# Patient Record
Sex: Female | Born: 1965 | Race: White | Hispanic: No | Marital: Married | Smoking: Former smoker
Health system: Southern US, Community
[De-identification: ages and names within clinical notes are randomized; demographics above are authoritative.]

## PROBLEM LIST (undated history)

## (undated) DIAGNOSIS — R51 Headache: Secondary | ICD-10-CM

## (undated) DIAGNOSIS — G35D Multiple sclerosis, unspecified: Secondary | ICD-10-CM

## (undated) DIAGNOSIS — R519 Headache, unspecified: Secondary | ICD-10-CM

## (undated) DIAGNOSIS — G35 Multiple sclerosis: Secondary | ICD-10-CM

## (undated) DIAGNOSIS — F329 Major depressive disorder, single episode, unspecified: Secondary | ICD-10-CM

## (undated) DIAGNOSIS — F32A Depression, unspecified: Secondary | ICD-10-CM

## (undated) DIAGNOSIS — E039 Hypothyroidism, unspecified: Secondary | ICD-10-CM

## (undated) HISTORY — DX: Headache, unspecified: R51.9

## (undated) HISTORY — DX: Headache: R51

## (undated) HISTORY — DX: Depression, unspecified: F32.A

## (undated) HISTORY — PX: AUGMENTATION MAMMAPLASTY: SUR837

## (undated) HISTORY — DX: Multiple sclerosis, unspecified: G35.D

## (undated) HISTORY — DX: Major depressive disorder, single episode, unspecified: F32.9

## (undated) HISTORY — PX: OTHER SURGICAL HISTORY: SHX169

## (undated) HISTORY — DX: Hypothyroidism, unspecified: E03.9

## (undated) HISTORY — PX: VAGINAL HYSTERECTOMY: SUR661

## (undated) HISTORY — DX: Multiple sclerosis: G35

---

## 2001-12-14 ENCOUNTER — Encounter: Payer: Self-pay | Admitting: Emergency Medicine

## 2001-12-14 ENCOUNTER — Emergency Department (HOSPITAL_COMMUNITY): Admission: EM | Admit: 2001-12-14 | Discharge: 2001-12-15 | Payer: Self-pay | Admitting: Emergency Medicine

## 2003-05-05 ENCOUNTER — Ambulatory Visit (HOSPITAL_COMMUNITY): Admission: RE | Admit: 2003-05-05 | Discharge: 2003-05-05 | Payer: Self-pay | Admitting: Podiatry

## 2003-09-17 ENCOUNTER — Ambulatory Visit (HOSPITAL_COMMUNITY): Admission: RE | Admit: 2003-09-17 | Discharge: 2003-09-17 | Payer: Self-pay | Admitting: Podiatry

## 2004-03-08 ENCOUNTER — Ambulatory Visit (HOSPITAL_COMMUNITY): Admission: RE | Admit: 2004-03-08 | Discharge: 2004-03-08 | Payer: Self-pay | Admitting: Family Medicine

## 2004-09-20 ENCOUNTER — Ambulatory Visit (HOSPITAL_COMMUNITY): Admission: RE | Admit: 2004-09-20 | Discharge: 2004-09-20 | Payer: Self-pay | Admitting: Obstetrics & Gynecology

## 2006-02-07 ENCOUNTER — Ambulatory Visit (HOSPITAL_COMMUNITY): Admission: RE | Admit: 2006-02-07 | Discharge: 2006-02-07 | Payer: Self-pay | Admitting: Obstetrics & Gynecology

## 2007-03-16 ENCOUNTER — Emergency Department (HOSPITAL_COMMUNITY): Admission: EM | Admit: 2007-03-16 | Discharge: 2007-03-16 | Payer: Self-pay | Admitting: Emergency Medicine

## 2007-04-04 ENCOUNTER — Observation Stay (HOSPITAL_COMMUNITY): Admission: RE | Admit: 2007-04-04 | Discharge: 2007-04-05 | Payer: Self-pay | Admitting: Obstetrics & Gynecology

## 2007-04-04 ENCOUNTER — Encounter: Payer: Self-pay | Admitting: Obstetrics & Gynecology

## 2007-08-03 ENCOUNTER — Ambulatory Visit (HOSPITAL_COMMUNITY): Admission: RE | Admit: 2007-08-03 | Discharge: 2007-08-03 | Payer: Self-pay | Admitting: Family Medicine

## 2007-10-09 ENCOUNTER — Encounter (HOSPITAL_COMMUNITY): Admission: RE | Admit: 2007-10-09 | Discharge: 2007-11-08 | Payer: Self-pay | Admitting: Internal Medicine

## 2008-04-06 ENCOUNTER — Emergency Department (HOSPITAL_COMMUNITY): Admission: EM | Admit: 2008-04-06 | Discharge: 2008-04-06 | Payer: Self-pay | Admitting: Emergency Medicine

## 2008-07-29 ENCOUNTER — Ambulatory Visit (HOSPITAL_COMMUNITY): Admission: RE | Admit: 2008-07-29 | Discharge: 2008-07-29 | Payer: Self-pay | Admitting: Family Medicine

## 2008-10-29 ENCOUNTER — Ambulatory Visit (HOSPITAL_COMMUNITY): Admission: RE | Admit: 2008-10-29 | Discharge: 2008-10-29 | Payer: Self-pay | Admitting: Obstetrics & Gynecology

## 2008-12-08 ENCOUNTER — Ambulatory Visit (HOSPITAL_COMMUNITY): Admission: RE | Admit: 2008-12-08 | Discharge: 2008-12-08 | Payer: Self-pay | Admitting: Neurology

## 2009-11-12 ENCOUNTER — Ambulatory Visit (HOSPITAL_COMMUNITY): Admission: RE | Admit: 2009-11-12 | Discharge: 2009-11-12 | Payer: Self-pay | Admitting: Obstetrics & Gynecology

## 2010-03-16 ENCOUNTER — Ambulatory Visit: Payer: Self-pay | Admitting: Internal Medicine

## 2010-03-23 ENCOUNTER — Ambulatory Visit (HOSPITAL_COMMUNITY)
Admission: RE | Admit: 2010-03-23 | Discharge: 2010-03-23 | Payer: Self-pay | Source: Home / Self Care | Attending: Internal Medicine | Admitting: Internal Medicine

## 2010-04-18 ENCOUNTER — Encounter: Payer: Self-pay | Admitting: Family Medicine

## 2010-04-19 ENCOUNTER — Encounter: Payer: Self-pay | Admitting: Family Medicine

## 2010-08-10 NOTE — Op Note (Signed)
NAME:  Sandra Carey, Sandra Carey NO.:  [id number]   MEDICAL RECORD NO.:  [id number]          PATIENT TYPE:  OBV   LOCATION:  A331                          FACILITY:  [facility]   PHYSICIAN:  Lazaro Arms, M.D.   DATE OF BIRTH:  April 29, 1965   DATE OF PROCEDURE:  04/04/2007  DATE OF DISCHARGE:                               OPERATIVE REPORT   PREOPERATIVE DIAGNOSES:  1. Dyspareunia.  2. Menometrorrhagia.  3. Dysmenorrhea.  4. Probable adenomyosis.   POSTOPERATIVE DIAGNOSES:  1. Dyspareunia.  2. Menometrorrhagia.  3. Dysmenorrhea.  4. Probable adenomyosis.   PROCEDURE:  Vaginal hysterectomy.   SURGEON:  Lazaro Arms, M.D.   ANESTHESIA:  General endotracheal.   FINDINGS:  The patient had a boggy, soft uterus maybe a small fibroid.  The tubes and ovaries normal.   DESCRIPTION OF OPERATION:  The patient was taken to the operating room,  placed in the supine position where she underwent general tracheal  anesthesia.  She was then placed in dorsal spine position, prepped,  draped in usual sterile fashion.  Half percent Marcaine was injected  circumferentially about the cervix for hemostasis and plane development.  Electrocautery unit was used and the vagina was incised  circumferentially about the cervix.  The anterior and posterior vagina  were pushed off the lower uterine segment without difficulty.  The  posterior cul-de-sac was entered sharply without difficulty.  The  uterosacral ligaments were clamped, cut and suture ligated and held.  The cardinal ligaments were clamped, cut and suture ligated and held.  The anterior vagina was pushed off the lower uterine segment but the  peritoneal reflection was high, so several passes taken up the cervix  through the cardinal ligament, each pedicle being clamped, cut and  suture ligated.  Anterior cul-de-sac was entered sharply without  difficulty.  The anterior posterior leaves of the broad ligament and the  uterine vessels  were clamped, cut and suture ligated bilaterally.  Serial pedicles taken up the cervix, up the fundus of the uterus, each  pedicle being clamped, cut and suture ligated.  The utero-ovarian  ligaments were crossclamped.  Specimen was removed.  Fore and aft single  sutures were placed with good hemostasis bilaterally.  Pelvis was  irrigated vigorously.  All pedicles found to be hemostatic.  The ovarian  sutures then cut free, the peritoneum was closed in a pursestring  fashion with 3-0 Monocryl.  The anterior and posterior vagina were  closed interrupted fashion, side to side with 0-0 Vicryl  without difficulty.  Again was hemostatic.  Blood loss of the procedure  was 100 mL.  She received Ancef 1 gram preoperatively.  She had SCDs  prior to initiation of anesthesia and throughout the procedure.  She was  awakened from anesthesia, taken to recovery room in good stable  condition.  All counts correct.      Lazaro Arms, M.D.  Electronically Signed     LHE/MEDQ  D:  04/04/2007  T:  04/04/2007  Job:  [id number]

## 2010-08-13 NOTE — Op Note (Signed)
NAME:  Sandra Carey, Sandra Carey NO.:  [id number]   MEDICAL RECORD NO.:  [id number]          PATIENT TYPE:  AMB   LOCATION:  DAY                           FACILITY:  [facility]   PHYSICIAN:  Lazaro Arms, M.D.   DATE OF BIRTH:  02-Aug-1965   DATE OF PROCEDURE:  09/20/2004  DATE OF DISCHARGE:                                 OPERATIVE REPORT   PREOPERATIVE DIAGNOSIS:  Multiparous female, desires permanent  sterilization.   POSTOPERATIVE DIAGNOSIS:  Multiparous female, desires permanent  sterilization.   PROCEDURES:  Laparoscopic bilateral tubal ligation using electrocautery.   SURGEON:  Lazaro Arms, M.D.   ANESTHESIA:  General endotracheal anesthesia.   FINDINGS:  The patient had normal uterus, tubes and ovaries and normal  intrauterine cavity.   DESCRIPTION OF OPERATION:  The patient was taken to the operating room and  placed in the supine position where she underwent general endotracheal  anesthesia.  She was then placed in the dorsolithotomy position and prepped  and draped in the usual sterile fashion.  An incision was made in the  umbilicus and a Veress needle was used and placed in the peritoneal cavity  with one pass without difficulty.  The peritoneal cavity was confirmed and  insufflated.  The nonbladed 11 mm trocar was used with direct visualization  laparoscope and we entered the peritoneal cavity with one pass without  difficulty without injury.  The pelvis was evaluated and found to be normal.  Both tubes were identified.  The left fallopian tube was burned in the  distal isthmic and ampullary regions of each tube.  No resistance beyond  using the electrocautery unit.  The right fallopian tube was identified and  burning the distal isthmic and ampullary regions of the tube, a 2.5 cm  segment bilaterally, again with good hemostasis.  They were burned to no  resistance and beyond.  They were checked once again and there was no  further bubbling or  cauterization of the tubal segments.  The instruments  were removed.  The gas was allowed to escape.  The fascia was closed using 0  Vicryl suture.  The subcutaneous tissue was closed using 3-0 Vicryl and the  skin was closed using Dermabond.  Marcaine 0.5% 6 mL was injected as a local  anesthetic.  The patient tolerated the procedure well.  She experienced no  blood loss and was taken to recovery in good stable condition.  All counts  were correct x 3.  She received Ancef prophylactically.       LHE/MEDQ  D:  09/20/2004  T:  09/20/2004  Job:  [id number]

## 2010-08-13 NOTE — Op Note (Signed)
NAME:  Sandra Carey, Sandra Carey                          ACCOUNT NO.:  [id number]   MEDICAL RECORD NO.:  [id number]                   PATIENT TYPE:  AMB   LOCATION:  DAY                                  FACILITY:  [facility]   PHYSICIAN:  Oley Balm. Pricilla Holm, D.P.M.             DATE OF BIRTH:  1966-02-12   DATE OF PROCEDURE:  05/05/2003  DATE OF DISCHARGE:                                 OPERATIVE REPORT   SURGEON:  Oley Balm. Pricilla Holm, D.P.M.   ANESTHESIA:  Monitored anesthesia care.   PREOPERATIVE DIAGNOSIS:  Hallux valgus deformity of right foot.   POSTOPERATIVE DIAGNOSIS:  Hallux valgus deformity of right foot.   PROCEDURE:  Austin bunionectomy, right foot.   INDICATIONS FOR PROCEDURE:  Longstanding history of pain unrelieved by  conservative care.   DESCRIPTION OF PROCEDURE:  The patient was brought into the operating room  and placed on the operating table in the supine position.  The patient's  lower right foot and leg was then prepped and draped in the usual aseptic  manner.   With an ankle tourniquet placed, well-padded to prevent contusions, elevated  to 250 mmHg, and adequate exsanguination of the right foot, the following  surgical procedures were then performed under monitored anesthesia care.   Procedure #1.  Austin bunionectomy, right foot.  Attention was directed to  the dorsomedial aspect of the right first MTP where a curvilinear incision  was made.  The incision was widened and deepened via sharp and blunt  dissection, being sure to identify and tract all vital structures.  A linear  capsular incision was then made, and the head of the metatarsal was freed  from the soft tissue attachments dorsally and medially.  Utilizing a Zimmer  oscillating saw, the medial eminence and medial aspect of the first  metatarsal head was resected.  Dissection was then carried down deep in the  first webspace where a lateral fibular sesamoid release was performed.  Attention was then redirected to  the medial aspect of the first metatarsal  where an Austin-type osteotomy was made, with the apex distal, the base  proximal, the capital fragments slid laterally, impacted, and fixated with  two 0.45 K-wires.  After fixation, it was noted that the osteotomy site was  stable.  The remaining protruding aspect of the metatarsal was resected.  All rough edges were rasped smooth.  The wound was lavaged with copious  amounts of sterile saline.  The capsule and subcutaneous tissues were  approximated utilizing sutures of 4-0 Dexon.  The skin was approximated  utilizing running subcuticular sutures of 4-0 Dexon and 4-0 Prolene.   All surgical sites were then infiltrated with approximately one-eighth cc of  dexamethasone phosphate.  Mild compressive bandages consisting of Betadine-  soaked Adaptic, sterile 4 x 4, and sterile Kling were then applied.   The patient tolerated the procedure well and left the operating room in  apparent good  condition with vital signs stable to the recovery room.      ___________________________________________                                            Oley Balm Pricilla Holm, D.P.M.   DBT/MEDQ  D:  05/05/2003  T:  05/05/2003  Job:  [id number]

## 2010-08-13 NOTE — Op Note (Signed)
NAME:  DEWANNA, HURSTON                            ACCOUNT NO.:  [id number]   MEDICAL RECORD NO.:  [id number]                  PATIENT TYPE:  AMB   LOCATION:                                       FACILITY:   PHYSICIAN:  Oley Balm. Pricilla Holm, D.P.M.             DATE OF BIRTH:   DATE OF PROCEDURE:  09/17/2003  DATE OF DISCHARGE:                                 OPERATIVE REPORT   SURGEON:  Oley Balm. Pricilla Holm, D.P.M.   TYPE OF ANESTHESIA:  Monitored anesthesia care.   PREOPERATIVE DIAGNOSIS:  Symptomatic internal fixation device, left foot.   POSTOPERATIVE DIAGNOSIS:  Symptomatic internal fixation device, left foot.   PROCEDURE:  Removal of pins x2, left foot.   INDICATIONS FOR SURGERY:  Long-standing history of pain.   DESCRIPTION OF PROCEDURE:  The patient was brought into the operating room  and placed on the operating room table in the supine position.  The  patient's lower left foot and leg was then prepped and draped in the usual  aseptic manner, then with an ankle tourniquet placed and well padded from  __________ to elevate to 250 mmHg, exsanguination of the left foot.  The  following surgical procedure was then performed under monitored anesthesia  care.   FIRST PROCEDURE:  Removal of pins x2, left foot.  Attention was directed to  the dorsal aspect of the left foot where a linear incision was made.  The  incision was widened deeply via sharp and blunt dissection making sure to  protect all vital structures.  A capsular incision was made.  The pins were  identified and removed.  The wound was lavaged with copious amounts of  sterile saline.  The skin was approximated utilizing horizontal mattress  suture of 4-0 Prolene.  A mild compressive bandage consisting of Betadine-  soaked Adaptic, sterile 4 x 4's and sterile cling were applied.  The patient  tolerated the procedure well and left the operating room in apparent good  condition.  Vital signs were stable in the recovery  room.      ___________________________________________                                            Oley Balm Pricilla Holm, D.P.M.   DBT/MEDQ  D:  09/17/2003  T:  09/18/2003  Job:  [id number]

## 2010-08-13 NOTE — H&P (Signed)
NAME:  [name], [name] NO.:  [id number]   MEDICAL RECORD NO.:  [id number]          PATIENT TYPE:  AMB   LOCATION:  DAY                           FACILITY:  [facility]   PHYSICIAN:  Lazaro Arms, M.D.   DATE OF BIRTH:  [birth year]   DATE OF ADMISSION:  DATE OF DISCHARGE:  LH                                HISTORY & PHYSICAL   HISTORY OF PRESENT ILLNESS:  Sandra Carey is a 45 year old gravida 2, para 2, last  menstrual period of September 03, 2004 who is on birth control pills, her birth  control method.  The patient had increasing problems with headaches and was  been on the pill for 10 years and desires to have permanent sterilization.   PAST MEDICAL HISTORY:  Negative.   PAST SURGICAL HISTORY:  She has had bilateral bunionectomies in 2004 and  2005.   PAST OB HISTORY:  Two vaginal deliveries.   ALLERGIES:  None.   MEDICATIONS:  Birth control pills.   REVIEW OF SYSTEMS:  Otherwise negative.   PHYSICAL EXAMINATION:  HEENT: Unremarkable.  Thyroid is normal and clear.  HEART:  Regular rate and rhythm.  No murmur, rub, or gallops. BREASTS:  Without mass or discharge, skin changes.  ABDOMEN:  Benign, without any  hepatosplenomegaly or masses.  GU:  She has normal external genitalia.  The  vagina is pink, well-estrogenated and without discharge.  Cervix parous.  Uterus normal in size, shape and contour.  Ovaries normal, nontender.  EXTREMITIES:  Warm with no edema.   IMPRESSION:  Multiparous female. Desires permanent sterilization.   PLAN:  The patient is admitted for laparoscopic bilateral tubal ligation  using electrocautery.  She understands the risks, benefits and alternatives  and the failure rate of 1 in 300.       LHE/MEDQ  D:  09/19/2004  T:  09/20/2004  Job:  [id number]

## 2010-08-13 NOTE — H&P (Signed)
NAME:  Sandra Carey, Sandra Carey NO.:  [id number]   MEDICAL RECORD NO.:  [id number]                  PATIENT TYPE:   LOCATION:                                       FACILITY:   PHYSICIAN:  Oley Balm. Pricilla Holm, D.P.M.             DATE OF BIRTH:   DATE OF ADMISSION:  05/05/2003  DATE OF DISCHARGE:                                HISTORY & PHYSICAL   CHIEF COMPLAINT:  Ms. Sipe is a 45 year old female who had chief complaint  of painful bilateral bunions on both feet. The patient relates that she has  had a long standing history of complaints with inability to get into wearing  closed shoes. She requests surgical correction of same. She previously has  had the left bunion corrected and now is back to have the right bunion  corrected.   PAST MEDICAL HISTORY:  Reveals 2 children, breast reconstructive surgery.  She has had no transfusions or hepatitis.   MEDICATIONS:  Birth control pills.   ALLERGIES:  SULFA DRUGS.   REVIEW OF SYSTEMS:  Uneventful.   PHYSICAL EXAMINATION:  EXTREMITIES:  Lower extremity examination reveals  palpable pedal pulses both DP and PT with spontaneous capillary filling  time.  NEUROLOGIC:  Examination essentially within normal limits.  MUSCULOSKELETAL:  Examination reveals the patient has a well healed scar on  left foot and a corrected bunion on the left first MTP. On the right foot,  the patient has hypertrophy of the medial and first metatarsal head of the  right foot with lateral deviation of the hallux, consistent with hallux  valgus deformity.   LABORATORY DATA:  X-rays confirm clinical diagnosis with increased  intermetatarsal angle and an increased hallux abductovalgus angle. There is  a full and free range of motion of the first MTP.   ASSESSMENT:  Hallux valgus deformity, right foot.   PLAN:  Reviewed both conservative and surgical management. Decided to again  perform an Serafina Royals. I reviewed the procedure with  the patient  including complications of procedure such as infection, bone infection,  postoperative pain, swelling, etc. The patient seems to understand the same  and surgery has been scheduled for May 05, 2003.     ___________________________________________                                         Oley Balm. Pricilla Holm, D.P.M.   DBT/MEDQ  D:  05/04/2003  T:  05/04/2003  Job:  [id number]

## 2010-12-07 ENCOUNTER — Other Ambulatory Visit: Payer: Self-pay | Admitting: Obstetrics & Gynecology

## 2010-12-07 DIAGNOSIS — Z139 Encounter for screening, unspecified: Secondary | ICD-10-CM

## 2010-12-14 ENCOUNTER — Ambulatory Visit (HOSPITAL_COMMUNITY)
Admission: RE | Admit: 2010-12-14 | Discharge: 2010-12-14 | Disposition: A | Discharge status: Home / Self Care | Payer: BC Managed Care – PPO | Source: Ambulatory Visit | Attending: Obstetrics & Gynecology | Admitting: Obstetrics & Gynecology

## 2010-12-14 DIAGNOSIS — Z139 Encounter for screening, unspecified: Secondary | ICD-10-CM

## 2010-12-14 DIAGNOSIS — Z1231 Encounter for screening mammogram for malignant neoplasm of breast: Secondary | ICD-10-CM | POA: Insufficient documentation

## 2010-12-15 LAB — DIFFERENTIAL
Basophils Absolute: 0
Basophils Absolute: 0
Basophils Relative: 0
Basophils Relative: 1
Eosinophils Absolute: 0.1
Eosinophils Absolute: 0.1
Eosinophils Relative: 1
Lymphocytes Relative: 33
Lymphs Abs: 1.4
Monocytes Absolute: 0.3
Monocytes Relative: 7
Neutro Abs: 2.6
Neutrophils Relative %: 59
Neutrophils Relative %: 68

## 2010-12-15 LAB — TYPE AND SCREEN
ABO/RH(D): A POS
Antibody Screen: POSITIVE
DAT, IgG: NEGATIVE
Donor AG Type: NEGATIVE
Donor AG Type: NEGATIVE
PT AG Type: NEGATIVE

## 2010-12-15 LAB — URINALYSIS, ROUTINE W REFLEX MICROSCOPIC
Bilirubin Urine: NEGATIVE
Glucose, UA: NEGATIVE
Hgb urine dipstick: NEGATIVE
Ketones, ur: NEGATIVE
Nitrite: NEGATIVE
Protein, ur: NEGATIVE
Specific Gravity, Urine: 1.02
Urobilinogen, UA: 0.2
pH: 6.5

## 2010-12-15 LAB — CBC
HCT: 35.7 — ABNORMAL LOW
Hemoglobin: 12
MCHC: 33.3
MCHC: 33.8
MCV: 88.1
MCV: 88.8
Platelets: 154
Platelets: 197
RBC: 4.05
RDW: 15.2
WBC: 4.4
WBC: 9.5

## 2010-12-15 LAB — COMPREHENSIVE METABOLIC PANEL
ALT: 11
AST: 17
Albumin: 4.1
Alkaline Phosphatase: 57
BUN: 15
CO2: 27
Calcium: 9.5
Chloride: 105
Creatinine, Ser: 0.76
GFR calc Af Amer: >60
GFR calc non Af Amer: >60
Glucose, Bld: 83
Potassium: 4.1
Sodium: 140
Total Bilirubin: 0.4
Total Protein: 6.7

## 2010-12-31 LAB — DIFFERENTIAL
Basophils Absolute: 0
Lymphocytes Relative: 8 — ABNORMAL LOW
Neutro Abs: 10 — ABNORMAL HIGH

## 2010-12-31 LAB — GC/CHLAMYDIA PROBE AMP, GENITAL
Chlamydia, DNA Probe: NEGATIVE
GC Probe Amp, Genital: NEGATIVE

## 2010-12-31 LAB — CBC
Platelets: 181
RDW: 15.4

## 2010-12-31 LAB — PREGNANCY, URINE: Preg Test, Ur: NEGATIVE

## 2011-01-17 ENCOUNTER — Other Ambulatory Visit: Payer: Self-pay | Admitting: Obstetrics & Gynecology

## 2011-11-08 ENCOUNTER — Other Ambulatory Visit: Payer: Self-pay | Admitting: Obstetrics & Gynecology

## 2011-11-08 DIAGNOSIS — Z139 Encounter for screening, unspecified: Secondary | ICD-10-CM

## 2011-12-19 ENCOUNTER — Ambulatory Visit (HOSPITAL_COMMUNITY): Admission: RE | Admit: 2011-12-19 | Payer: BC Managed Care – PPO | Source: Ambulatory Visit

## 2011-12-26 ENCOUNTER — Ambulatory Visit (HOSPITAL_COMMUNITY)
Admission: RE | Admit: 2011-12-26 | Discharge: 2011-12-26 | Disposition: A | Discharge status: Home / Self Care | Payer: BC Managed Care – PPO | Source: Ambulatory Visit | Attending: Obstetrics & Gynecology | Admitting: Obstetrics & Gynecology

## 2011-12-26 DIAGNOSIS — Z139 Encounter for screening, unspecified: Secondary | ICD-10-CM

## 2011-12-26 DIAGNOSIS — Z1231 Encounter for screening mammogram for malignant neoplasm of breast: Secondary | ICD-10-CM | POA: Insufficient documentation

## 2012-08-03 ENCOUNTER — Other Ambulatory Visit (HOSPITAL_COMMUNITY): Payer: Self-pay | Admitting: Specialist

## 2012-08-03 DIAGNOSIS — G43909 Migraine, unspecified, not intractable, without status migrainosus: Secondary | ICD-10-CM

## 2012-08-03 DIAGNOSIS — M4802 Spinal stenosis, cervical region: Secondary | ICD-10-CM

## 2012-08-07 ENCOUNTER — Ambulatory Visit (HOSPITAL_COMMUNITY)
Admission: RE | Admit: 2012-08-07 | Discharge: 2012-08-07 | Disposition: A | Discharge status: Home / Self Care | Payer: BC Managed Care – PPO | Source: Ambulatory Visit | Attending: Specialist | Admitting: Specialist

## 2012-08-07 ENCOUNTER — Encounter (HOSPITAL_COMMUNITY): Payer: Self-pay

## 2012-08-07 DIAGNOSIS — G43909 Migraine, unspecified, not intractable, without status migrainosus: Secondary | ICD-10-CM

## 2012-08-07 DIAGNOSIS — M5020 Other cervical disc displacement, unspecified cervical region: Secondary | ICD-10-CM | POA: Insufficient documentation

## 2012-08-07 DIAGNOSIS — R209 Unspecified disturbances of skin sensation: Secondary | ICD-10-CM | POA: Insufficient documentation

## 2012-08-07 DIAGNOSIS — M4802 Spinal stenosis, cervical region: Secondary | ICD-10-CM

## 2012-09-13 ENCOUNTER — Other Ambulatory Visit (HOSPITAL_COMMUNITY): Payer: Self-pay | Admitting: Family Medicine

## 2012-09-13 DIAGNOSIS — R1013 Epigastric pain: Secondary | ICD-10-CM

## 2012-09-18 ENCOUNTER — Encounter (HOSPITAL_COMMUNITY)
Admission: RE | Admit: 2012-09-18 | Discharge: 2012-09-18 | Disposition: A | Discharge status: Home / Self Care | Payer: BC Managed Care – PPO | Source: Ambulatory Visit | Attending: Family Medicine | Admitting: Family Medicine

## 2012-09-18 ENCOUNTER — Encounter (HOSPITAL_COMMUNITY): Payer: Self-pay

## 2012-09-18 DIAGNOSIS — R1013 Epigastric pain: Secondary | ICD-10-CM | POA: Insufficient documentation

## 2012-09-18 MED ORDER — TECHNETIUM TC 99M MEBROFENIN IV KIT
5.0000 | PACK | Freq: Once | INTRAVENOUS | Status: AC | PRN
  Administered 2012-09-18: 5 via INTRAVENOUS

## 2012-09-20 ENCOUNTER — Encounter (INDEPENDENT_AMBULATORY_CARE_PROVIDER_SITE_OTHER): Payer: Self-pay | Admitting: Internal Medicine

## 2012-09-20 ENCOUNTER — Ambulatory Visit (INDEPENDENT_AMBULATORY_CARE_PROVIDER_SITE_OTHER): Payer: BC Managed Care – PPO | Admitting: Internal Medicine

## 2012-09-20 ENCOUNTER — Other Ambulatory Visit (INDEPENDENT_AMBULATORY_CARE_PROVIDER_SITE_OTHER): Payer: Self-pay | Admitting: *Deleted

## 2012-09-20 ENCOUNTER — Telehealth (INDEPENDENT_AMBULATORY_CARE_PROVIDER_SITE_OTHER): Payer: Self-pay | Admitting: Internal Medicine

## 2012-09-20 ENCOUNTER — Encounter (INDEPENDENT_AMBULATORY_CARE_PROVIDER_SITE_OTHER): Payer: Self-pay | Admitting: *Deleted

## 2012-09-20 VITALS — BP 94/56 | HR 64 | Temp 98.1°F | Ht 65.0 in | Wt 134.0 lb

## 2012-09-20 DIAGNOSIS — R1011 Right upper quadrant pain: Secondary | ICD-10-CM | POA: Insufficient documentation

## 2012-09-20 DIAGNOSIS — E039 Hypothyroidism, unspecified: Secondary | ICD-10-CM | POA: Insufficient documentation

## 2012-09-20 DIAGNOSIS — R519 Headache, unspecified: Secondary | ICD-10-CM | POA: Insufficient documentation

## 2012-09-20 MED ORDER — HYOSCYAMINE SULFATE 0.125 MG SL SUBL: 0.1250 mg | SUBLINGUAL_TABLET | SUBLINGUAL | Status: DC | PRN

## 2012-09-20 NOTE — Telephone Encounter (Signed)
Sandra Carey, patient would like a referral to see Dr. Franky Macho. Please arrange. Rt upper quadrant pain reproducible during HIDA scan.

## 2012-09-20 NOTE — Telephone Encounter (Signed)
Referral & notes have been faxed to Dr Lovell Sheehan office, just waiting for a return call for appt

## 2012-09-20 NOTE — Patient Instructions (Addendum)
EGD with Dr. Magnus Sinning

## 2012-09-20 NOTE — Telephone Encounter (Signed)
Addressed.

## 2012-09-20 NOTE — Progress Notes (Signed)
Subjective:     Patient ID: Sandra Carey, female   DOB: 1966/02/02, 47 y.o.   MRN: [id number]  [facility] rt upper quadrant pain.  HIDA was normal. She tells me she stays constipated and bloated. The pain comes and goes depending on what she eats. She does not have acid reflux. She does have a lot of belching. She has pain rt upper quadrant. She has to sit straight up to get a good breath.  She has cut out full fat dairy. She eats low fat yogurt, she does not eat red meat. She tells me her symptoms are worse when she drinks coffee.  She cannot eat watermelon and cantalope. She has cut out gluten and sugar in her diet.  She has had this pain x 7-8 months.  She thought Robaxin was causing these symptoms.  Appetite for the most part is good.  Lot 4-5 pounds since last weight due to her diet. She usually has a BM about twice a week. She does take a laxative to have a BM. Her stools are dark brown in color.  09/13/2012 HIDA scan:  IMPRESSION:  Patent biliary tree.  Normal gallbladder ejection fraction of 88% following fatty meal  stimulation.  Patient experienced abdominal pain following Ensure ingestion.  When compared to previous exam gallbladder ejection fraction has  increased from 47% to 88%.   Review of Systems see hpi has a current medication list which includes the following prescription(s): ibuprofen and levothyroxine. Past Medical History  Diagnosis Date  . Hypothyroid   . Generalized headaches    Past Surgical History  Procedure Laterality Date  . Bunions surgery      Dr. Pricilla Holm 8-9 yrs ago   Past Medical History  Diagnosis Date  . Hypothyroid   . Generalized headaches    No current outpatient prescriptions on file prior to visit.   No current facility-administered medications on file prior to visit.   Past Surgical History  Procedure Laterality Date  . Bunions surgery      Dr. Pricilla Holm 8-9 yrs ago   Allergies  Allergen Reactions  .  Sulfa Antibiotics         Objective:   Physical Exam  Filed Vitals:   09/20/12 1002  BP: 94/56  Pulse: 64  Temp: 98.1 F (36.7 C)  Height: 5\' 5"  (1.651 m)  Weight: 134 lb (60.782 kg)   Alert and oriented. Skin warm and dry. Oral mucosa is moist.   . Sclera anicteric, conjunctivae is pink. Thyroid not enlarged. No cervical lymphadenopathy. Lungs clear. Heart regular rate and rhythm.  Abdomen is soft. Bowel sounds are positive. No hepatomegaly. No abdominal masses felt. Tenderness rt upper quadrant..  No edema to lower extremities.       Assessment:    Rt upper quadrant pain. ? PUD. ? Cholecystitis. Pain reproduced with HIDA scan.       Plan:    EGD with Dr Karilyn Cota. If EGD is normal, may need referral for possible cholecystitis.

## 2012-09-24 ENCOUNTER — Encounter (INDEPENDENT_AMBULATORY_CARE_PROVIDER_SITE_OTHER): Payer: Self-pay | Admitting: *Deleted

## 2012-09-24 NOTE — Telephone Encounter (Signed)
Patient has appt w/ Dr Lovell Sheehan 10/11/12 at [phone]), patient aware

## 2012-10-03 ENCOUNTER — Encounter (HOSPITAL_COMMUNITY): Payer: Self-pay | Admitting: Pharmacy Technician

## 2012-10-11 ENCOUNTER — Encounter (HOSPITAL_COMMUNITY): Payer: Self-pay | Admitting: Pharmacy Technician

## 2012-10-11 ENCOUNTER — Encounter (HOSPITAL_COMMUNITY): Payer: Self-pay

## 2012-10-11 ENCOUNTER — Encounter (HOSPITAL_COMMUNITY)
Admission: RE | Admit: 2012-10-11 | Discharge: 2012-10-11 | Disposition: A | Discharge status: Home / Self Care | Payer: BC Managed Care – PPO | Source: Ambulatory Visit | Attending: General Surgery | Admitting: General Surgery

## 2012-10-11 DIAGNOSIS — Z01812 Encounter for preprocedural laboratory examination: Secondary | ICD-10-CM | POA: Insufficient documentation

## 2012-10-11 LAB — COMPREHENSIVE METABOLIC PANEL
ALT: 11 U/L (ref 0–35)
AST: 16 U/L (ref 0–37)
Albumin: 4.2 g/dL (ref 3.5–5.2)
Alkaline Phosphatase: 59 U/L (ref 39–117)
Calcium: 10.1 mg/dL (ref 8.4–10.5)
GFR calc Af Amer: >90 mL/min (ref >90)
Potassium: 4.2 mEq/L (ref 3.5–5.1)
Sodium: 138 mEq/L (ref 135–145)
Total Protein: 7.4 g/dL (ref 6.0–8.3)

## 2012-10-11 LAB — CBC WITH DIFFERENTIAL/PLATELET
Basophils Relative: 0 % (ref 0–1)
Hemoglobin: 14 g/dL (ref 12.0–15.0)
MCHC: 35.4 g/dL (ref 30.0–36.0)
Monocytes Relative: 6 % (ref 3–12)
Neutro Abs: 4.2 10*3/uL (ref 1.7–7.7)
Neutrophils Relative %: 64 % (ref 43–77)
Platelets: 184 10*3/uL (ref 150–400)
RBC: 4.28 MIL/uL (ref 3.87–5.11)

## 2012-10-11 NOTE — Patient Instructions (Addendum)
Sandra Carey  10/11/2012   Your procedure is scheduled on:  10/15/2012  Report to [facility] at  810  AM.  Call this number if you have problems the morning of surgery: [phone]   Remember:   Do not eat food or drink liquids after midnight.   Take these medicines the morning of surgery with A SIP OF WATER: levsin, synthroid   Do not wear jewelry, make-up or nail polish.  Do not wear lotions, powders, or perfumes.   Do not shave 48 hours prior to surgery. Men may shave face and neck.  Do not bring valuables to the hospital.  [facility] is not responsible for any belongings or valuables.  Contacts, dentures or bridgework may not be worn into surgery.  Leave suitcase in the car. After surgery it may be brought to your room.  For patients admitted to the hospital, checkout time is 11:00 AM the day of discharge.   Patients discharged the day of surgery will not be allowed to drive home.  Name and phone number of your driver: family  Special Instructions: Shower using CHG 2 nights before surgery and the night before surgery.  If you shower the day of surgery use CHG.  Use special wash - you have one bottle of CHG for all showers.  You should use approximately 1/3 of the bottle for each shower.   Please read over the following fact sheets that you were given: Pain Booklet, Coughing and Deep Breathing, Surgical Site Infection Prevention, Anesthesia Post-op Instructions and Care and Recovery After Surgery Laparoscopic Cholecystectomy Laparoscopic cholecystectomy is surgery to remove the gallbladder. The gallbladder is located slightly to the right of center in the abdomen, behind the liver. It is a concentrating and storage sac for the bile produced in the liver. Bile aids in the digestion and absorption of fats. Gallbladder disease (cholecystitis) is an inflammation of your gallbladder. This condition is usually caused by a buildup of gallstones (cholelithiasis) in your gallbladder.  Gallstones can block the flow of bile, resulting in inflammation and pain. In severe cases, emergency surgery may be required. When emergency surgery is not required, you will have time to prepare for the procedure. Laparoscopic surgery is an alternative to open surgery. Laparoscopic surgery usually has a shorter recovery time. Your common bile duct may also need to be examined and explored. Your caregiver will discuss this with you if he or she feels this should be done. If stones are found in the common bile duct, they may be removed. LET YOUR CAREGIVER KNOW ABOUT:  Allergies to food or medicine.  Medicines taken, including vitamins, herbs, eyedrops, over-the-counter medicines, and creams.  Use of steroids (by mouth or creams).  Previous problems with anesthetics or numbing medicines.  History of bleeding problems or blood clots.  Previous surgery.  Other health problems, including diabetes and kidney problems.  Possibility of pregnancy, if this applies. RISKS AND COMPLICATIONS All surgery is associated with risks. Some problems that may occur following this procedure include:  Infection.  Damage to the common bile duct, nerves, arteries, veins, or other internal organs such as the stomach or intestines.  Bleeding.  A stone may remain in the common bile duct. BEFORE THE PROCEDURE  Do not take aspirin for 3 days prior to surgery or blood thinners for 1 week prior to surgery.  Do not eat or drink anything after midnight the night before surgery.  Let your caregiver know if you develop a  cold or other infectious problem prior to surgery.  You should be present 60 minutes before the procedure or as directed. PROCEDURE  You will be given medicine that makes you sleep (general anesthetic). When you are asleep, your surgeon will make several small cuts (incisions) in your abdomen. One of these incisions is used to insert a small, lighted scope (laparoscope) into the abdomen. The  laparoscope helps the surgeon see into your abdomen. Carbon dioxide gas will be pumped into your abdomen. The gas allows more room for the surgeon to perform your surgery. Other operating instruments are inserted through the other incisions. Laparoscopic procedures may not be appropriate when:  There is major scarring from previous surgery.  The gallbladder is extremely inflamed.  There are bleeding disorders or unexpected cirrhosis of the liver.  A pregnancy is near term.  Other conditions make the laparoscopic procedure impossible. If your surgeon feels it is not safe to continue with a laparoscopic procedure, he or she will perform an open abdominal procedure. In this case, the surgeon will make an incision to open the abdomen. This gives the surgeon a larger view and field to work within. This may allow the surgeon to perform procedures that sometimes cannot be performed with a laparoscope alone. Open surgery has a longer recovery time. AFTER THE PROCEDURE  You will be taken to the recovery area where a nurse will watch and check your progress.  You may be allowed to go home the same day.  Do not resume physical activities until directed by your caregiver.  You may resume a normal diet and activities as directed. Document Released: 03/14/2005 Document Revised: 06/06/2011 Document Reviewed: 08/27/2010 [facility] Patient Information 2014 [place], [place]. PATIENT INSTRUCTIONS POST-ANESTHESIA  IMMEDIATELY FOLLOWING SURGERY:  Do not drive or operate machinery for the first twenty four hours after surgery.  Do not make any important decisions for twenty four hours after surgery or while taking narcotic pain medications or sedatives.  If you develop intractable nausea and vomiting or a severe headache please notify your doctor immediately.  FOLLOW-UP:  Please make an appointment with your surgeon as instructed. You do not need to follow up with anesthesia unless specifically instructed to do  so.  WOUND CARE INSTRUCTIONS (if applicable):  Keep a dry clean dressing on the anesthesia/puncture wound site if there is drainage.  Once the wound has quit draining you may leave it open to air.  Generally you should leave the bandage intact for twenty four hours unless there is drainage.  If the epidural site drains for more than 36-48 hours please call the anesthesia department.  QUESTIONS?:  Please feel free to call your physician or the hospital operator if you have any questions, and they will be happy to assist you.

## 2012-10-11 NOTE — H&P (Signed)
  NTS SOAP Note  Vital Signs:  Vitals as of: 10/11/2012: Systolic 107: Diastolic 42: Heart Rate 64: Temp 85F: Height 30ft 4in: Weight 133Lbs 0 Ounces: Pain Level 2: BMI 22.83  BMI : 22.83 kg/m2  Subjective: This 47 Years 1 Months old Female presents for abdominal pain and nausea.  Has been having intermittent right upper quadrant abdominal pain with radiation to the right flank, nausea, and fatty food intolerance.  Seen by Dr. Karilyn Cota, who feels her gallbladder needs to come out.  HIDA scan showed normal ejection fraction, but reproducible symptoms with fatty meal.  Is worsening recently. No fever, chills, jaundice.  Review of Symptoms:  Constitutional:  fatigue    Headaches Eyes:  blurred vision bilateral Nose/Mouth/Throat:unremarkable Respiratory:unremarkable   Gastrointestinal:    abdominal pain,nausea,vomiting Genitourinary:    frequency   back pain dry skin Hematolgic/Lymphatic:unremarkable     Allergic/Immunologic:unremarkable     Past Medical History:    Reviewed   Past Medical History  Surgical History: breast augmentation, BTL, feet surgery, TAH Medical Problems: hypothyroidism Allergies: sulfa Medications: thyroid pill, hyoscyamine   Social History:Reviewed  Social History  Preferred Language: English Race:  White Ethnicity: Not Hispanic / Latino Age: 47 Years 0 Months Marital Status:  M Alcohol: rarely Recreational drug(s):  No   Smoking Status: Former smoker reviewed on 10/11/2012 Started Date: 03/29/1983 Stopped Date: 03/28/1994 Functional Status reviewed on mm/dd/yyyy ------------------------------------------------ Bathing: Normal Cooking: Normal Dressing: Normal Driving: Normal Eating: Normal Managing Meds: Normal Oral Care: Normal Shopping: Normal Toileting: Normal Transferring: Normal Walking: Normal Cognitive Status reviewed on  mm/dd/yyyy ------------------------------------------------ Attention: Normal Decision Making: Normal Language: Normal Memory: Normal Motor: Normal Perception: Normal Problem Solving: Normal Visual and Spatial: Normal   Family History:  Reviewed  Family Health History Family History is Unknown    Objective Information: General:  Well appearing, well nourished in no distress.   no scleral icterus Heart:  RRR, no murmur Lungs:    CTA bilaterally, no wheezes, rhonchi, rales.  Breathing unlabored. Abdomen:Soft, NT/ND, no HSM, no masses.  Slightly tender in right upper quadrant to deep palpation.  Assessment:Chronic cholecystitis  Diagnosis &amp; Procedure Smart Code   Plan:Scheduled for laparoscopic cholecystectomy on 10/15/12.   Patient Education:Alternative treatments to surgery were discussed with patient (and family).  Risks and benefits  of procedure including bleeding, infection, hepatobiliary injury, and the possibility of an open procedure were fully explained to the patient (and family) who gave informed consent. Patient/family questions were addressed.  Follow-up:Pending Surgery

## 2012-10-15 ENCOUNTER — Ambulatory Visit (HOSPITAL_COMMUNITY): Payer: BC Managed Care – PPO | Admitting: Anesthesiology

## 2012-10-15 ENCOUNTER — Ambulatory Visit (HOSPITAL_COMMUNITY)
Admission: RE | Admit: 2012-10-15 | Discharge: 2012-10-15 | Disposition: A | Discharge status: Home / Self Care | Payer: BC Managed Care – PPO | Source: Ambulatory Visit | Attending: General Surgery | Admitting: General Surgery

## 2012-10-15 ENCOUNTER — Encounter (HOSPITAL_COMMUNITY)
Admission: RE | Disposition: A | Discharge status: Home / Self Care | Payer: Self-pay | Source: Ambulatory Visit | Attending: General Surgery

## 2012-10-15 ENCOUNTER — Encounter (HOSPITAL_COMMUNITY): Payer: Self-pay | Admitting: Anesthesiology

## 2012-10-15 DIAGNOSIS — K811 Chronic cholecystitis: Secondary | ICD-10-CM | POA: Insufficient documentation

## 2012-10-15 HISTORY — PX: CHOLECYSTECTOMY: SHX55

## 2012-10-15 SURGERY — LAPAROSCOPIC CHOLECYSTECTOMY
Anesthesia: General | Site: Abdomen | Wound class: Clean Contaminated

## 2012-10-15 MED ORDER — MIDAZOLAM HCL 2 MG/2ML IJ SOLN
INTRAMUSCULAR | Status: AC
  Filled 2012-10-15: qty 2

## 2012-10-15 MED ORDER — ROCURONIUM BROMIDE 50 MG/5ML IV SOLN
INTRAVENOUS | Status: AC
  Filled 2012-10-15: qty 1

## 2012-10-15 MED ORDER — FENTANYL CITRATE 0.05 MG/ML IJ SOLN
25.0000 ug | INTRAMUSCULAR | Status: DC | PRN
  Administered 2012-10-15: 25 ug via INTRAVENOUS
  Administered 2012-10-15: 50 ug via INTRAVENOUS

## 2012-10-15 MED ORDER — ONDANSETRON HCL 4 MG/2ML IJ SOLN
4.0000 mg | Freq: Once | INTRAMUSCULAR | Status: AC | PRN
  Administered 2012-10-15: 4 mg via INTRAVENOUS

## 2012-10-15 MED ORDER — NEOSTIGMINE METHYLSULFATE 1 MG/ML IJ SOLN
INTRAMUSCULAR | Status: AC
  Filled 2012-10-15: qty 1

## 2012-10-15 MED ORDER — CEFAZOLIN SODIUM-DEXTROSE 2-3 GM-% IV SOLR
INTRAVENOUS | Status: AC
  Filled 2012-10-15: qty 50

## 2012-10-15 MED ORDER — BUPIVACAINE HCL (PF) 0.5 % IJ SOLN
INTRAMUSCULAR | Status: DC | PRN
  Administered 2012-10-15: 10 mL

## 2012-10-15 MED ORDER — ONDANSETRON HCL 4 MG/2ML IJ SOLN
INTRAMUSCULAR | Status: AC
  Filled 2012-10-15: qty 2

## 2012-10-15 MED ORDER — SODIUM CHLORIDE 0.9 % IR SOLN
Status: DC | PRN
  Administered 2012-10-15: 1000 mL

## 2012-10-15 MED ORDER — CHLORHEXIDINE GLUCONATE 4 % EX LIQD: 1.0000 "application " | Freq: Once | CUTANEOUS | Status: DC

## 2012-10-15 MED ORDER — DEXAMETHASONE SODIUM PHOSPHATE 4 MG/ML IJ SOLN
INTRAMUSCULAR | Status: AC
  Filled 2012-10-15: qty 1

## 2012-10-15 MED ORDER — FENTANYL CITRATE 0.05 MG/ML IJ SOLN
INTRAMUSCULAR | Status: AC
  Filled 2012-10-15: qty 5

## 2012-10-15 MED ORDER — KETOROLAC TROMETHAMINE 30 MG/ML IJ SOLN
INTRAMUSCULAR | Status: AC
  Filled 2012-10-15: qty 1

## 2012-10-15 MED ORDER — FENTANYL CITRATE 0.05 MG/ML IJ SOLN
INTRAMUSCULAR | Status: AC
  Administered 2012-10-15: 25 ug via INTRAVENOUS
  Filled 2012-10-15: qty 2

## 2012-10-15 MED ORDER — KETOROLAC TROMETHAMINE 30 MG/ML IJ SOLN
30.0000 mg | Freq: Once | INTRAMUSCULAR | Status: AC
  Administered 2012-10-15: 30 mg via INTRAVENOUS

## 2012-10-15 MED ORDER — PROPOFOL 10 MG/ML IV EMUL
INTRAVENOUS | Status: AC
  Filled 2012-10-15: qty 20

## 2012-10-15 MED ORDER — HYDROCODONE-ACETAMINOPHEN 5-325 MG PO TABS: 1.0000 | ORAL_TABLET | ORAL | Status: DC | PRN

## 2012-10-15 MED ORDER — GLYCOPYRROLATE 0.2 MG/ML IJ SOLN
INTRAMUSCULAR | Status: AC
  Filled 2012-10-15: qty 3

## 2012-10-15 MED ORDER — LACTATED RINGERS IV SOLN
INTRAVENOUS | Status: DC
  Administered 2012-10-15: 1000 mL via INTRAVENOUS
  Administered 2012-10-15: 09:00:00 via INTRAVENOUS

## 2012-10-15 MED ORDER — CEFAZOLIN SODIUM-DEXTROSE 2-3 GM-% IV SOLR: 2.0000 g | INTRAVENOUS | Status: DC

## 2012-10-15 MED ORDER — LIDOCAINE HCL (PF) 1 % IJ SOLN
INTRAMUSCULAR | Status: AC
  Filled 2012-10-15: qty 5

## 2012-10-15 MED ORDER — HEMOSTATIC AGENTS (NO CHARGE) OPTIME
TOPICAL | Status: DC | PRN
  Administered 2012-10-15: 1 via TOPICAL

## 2012-10-15 MED ORDER — ONDANSETRON HCL 4 MG/2ML IJ SOLN
4.0000 mg | Freq: Once | INTRAMUSCULAR | Status: AC
  Administered 2012-10-15: 4 mg via INTRAVENOUS

## 2012-10-15 MED ORDER — DEXAMETHASONE SODIUM PHOSPHATE 4 MG/ML IJ SOLN
4.0000 mg | Freq: Once | INTRAMUSCULAR | Status: AC
  Administered 2012-10-15: 4 mg via INTRAVENOUS

## 2012-10-15 MED ORDER — BUPIVACAINE HCL (PF) 0.5 % IJ SOLN
INTRAMUSCULAR | Status: AC
  Filled 2012-10-15: qty 30

## 2012-10-15 MED ORDER — MIDAZOLAM HCL 2 MG/2ML IJ SOLN
1.0000 mg | INTRAMUSCULAR | Status: DC | PRN
  Administered 2012-10-15 (×2): 1 mg via INTRAVENOUS

## 2012-10-15 SURGICAL SUPPLY — 38 items
APPLIER CLIP LAPSCP 10X32 DD (CLIP) ×2 IMPLANT
BAG HAMPER (MISCELLANEOUS) ×2 IMPLANT
BAG SPEC RTRVL LRG 6X4 10 (ENDOMECHANICALS) ×1
CLOTH BEACON ORANGE TIMEOUT ST (SAFETY) ×2 IMPLANT
COVER LIGHT HANDLE STERIS (MISCELLANEOUS) ×4 IMPLANT
DECANTER SPIKE VIAL GLASS SM (MISCELLANEOUS) ×2 IMPLANT
DURAPREP 26ML APPLICATOR (WOUND CARE) ×2 IMPLANT
ELECT REM PT RETURN 9FT ADLT (ELECTROSURGICAL) ×2
ELECTRODE REM PT RTRN 9FT ADLT (ELECTROSURGICAL) ×1 IMPLANT
FILTER SMOKE EVAC LAPAROSHD (FILTER) ×2 IMPLANT
FORMALIN 10 PREFIL 120ML (MISCELLANEOUS) ×2 IMPLANT
GLOVE BIO SURGEON STRL SZ7.5 (GLOVE) ×2 IMPLANT
GLOVE BIOGEL PI IND STRL 7.0 (GLOVE) ×2 IMPLANT
GLOVE BIOGEL PI INDICATOR 7.0 (GLOVE) ×2
GLOVE ECLIPSE 6.5 STRL STRAW (GLOVE) ×2 IMPLANT
GLOVE EXAM NITRILE MD LF STRL (GLOVE) ×2 IMPLANT
GLOVE SS BIOGEL STRL SZ 6.5 (GLOVE) ×1 IMPLANT
GLOVE SUPERSENSE BIOGEL SZ 6.5 (GLOVE) ×1
GOWN STRL REIN XL XLG (GOWN DISPOSABLE) ×6 IMPLANT
HEMOSTAT SNOW SURGICEL 2X4 (HEMOSTASIS) ×2 IMPLANT
INST SET LAPROSCOPIC AP (KITS) ×2 IMPLANT
IV NS IRRIG 3000ML ARTHROMATIC (IV SOLUTION) IMPLANT
KIT ROOM TURNOVER APOR (KITS) ×2 IMPLANT
KIT TROCAR LAP CHOLE (TROCAR) ×2 IMPLANT
MANIFOLD NEPTUNE II (INSTRUMENTS) ×2 IMPLANT
NS IRRIG 1000ML POUR BTL (IV SOLUTION) ×2 IMPLANT
PACK LAP CHOLE LZT030E (CUSTOM PROCEDURE TRAY) ×2 IMPLANT
PAD ARMBOARD 7.5X6 YLW CONV (MISCELLANEOUS) ×2 IMPLANT
POUCH SPECIMEN RETRIEVAL 10MM (ENDOMECHANICALS) ×2 IMPLANT
SET BASIN LINEN APH (SET/KITS/TRAYS/PACK) ×2 IMPLANT
SET TUBE IRRIG SUCTION NO TIP (IRRIGATION / IRRIGATOR) IMPLANT
SPONGE GAUZE 2X2 8PLY STRL LF (GAUZE/BANDAGES/DRESSINGS) ×8 IMPLANT
STAPLER VISISTAT (STAPLE) ×2 IMPLANT
SUT VICRYL 0 UR6 27IN ABS (SUTURE) ×2 IMPLANT
TAPE CLOTH SURG 4X10 WHT LF (GAUZE/BANDAGES/DRESSINGS) ×2 IMPLANT
TUBING INSUFFLATION (TUBING) ×2 IMPLANT
WARMER LAPAROSCOPE (MISCELLANEOUS) ×2 IMPLANT
YANKAUER SUCT 12FT TUBE ARGYLE (SUCTIONS) ×2 IMPLANT

## 2012-10-15 NOTE — Anesthesia Postprocedure Evaluation (Signed)
Anesthesia Post Note  Patient: Sandra Carey  Procedure(s) Performed: Procedure(s) (LRB): LAPAROSCOPIC CHOLECYSTECTOMY (N/A)  Anesthesia type: General  Patient location: PACU  Post pain: Pain level controlled  Post assessment: Post-op Vital signs reviewed, Patient's Cardiovascular Status Stable, Respiratory Function Stable, Patent Airway, No signs of Nausea or vomiting and Pain level controlled  Last Vitals:  Filed Vitals:   10/15/12 0948  BP: 102/53  Pulse: 89  Temp: 36.6 C  Resp: 12    Post vital signs: Reviewed and stable  Level of consciousness: awake and alert   Complications: No apparent anesthesia complications

## 2012-10-15 NOTE — Interval H&P Note (Signed)
History and Physical Interval Note:  10/15/2012 8:19 AM  Sandra Carey  has presented today for surgery, with the diagnosis of chronic cholelithiasis  The various methods of treatment have been discussed with the patient and family. After consideration of risks, benefits and other options for treatment, the patient has consented to  Procedure(s): LAPAROSCOPIC CHOLECYSTECTOMY (N/A) as a surgical intervention .  The patient's history has been reviewed, patient examined, no change in status, stable for surgery.  I have reviewed the patient's chart and labs.  Questions were answered to the patient's satisfaction.     Franky Macho A

## 2012-10-15 NOTE — Transfer of Care (Signed)
Immediate Anesthesia Transfer of Care Note  Patient: Sandra Carey  Procedure(s) Performed: Procedure(s) (LRB): LAPAROSCOPIC CHOLECYSTECTOMY (N/A)  Patient Location: PACU  Anesthesia Type: General  Level of Consciousness: awake  Airway & Oxygen Therapy: Patient Spontanous Breathing and non-rebreather face mask  Post-op Assessment: Report given to PACU RN, Post -op Vital signs reviewed and stable and Patient moving all extremities  Post vital signs: Reviewed and stable  Complications: No apparent anesthesia complications

## 2012-10-15 NOTE — Op Note (Signed)
Patient:  Sandra Carey  DOB:  [birth year]  MRN:  [id number]   Preop Diagnosis:  Chronic cholecystitis  Postop Diagnosis:  Same  Procedure:  Laparoscopic cholecystectomy  Surgeon:  Franky Macho, M.D.  Anes:  General endotracheal  Indications:  Patient is a 47 year old white female who presents with biliary colic secondary to chronic cholecystitis. The risks and benefits of the procedure including bleeding, infection, hepatobiliary, and the possibility of an open procedure were fully explained to the patient, who gave informed consent.  Procedure note:   The patient was placed in the supine position. After induction of general endotracheal anesthesia, the abdomen was prepped and draped using usual sterile technique with DuraPrep. Surgical site confirmation was performed.  An infraumbilical incision was made down to the fascia. A Veress needle was introduced into the abdominal cavity and confirmation of placement was done using the saline drop test. The abdomen was then insufflated to 16 mm mercury pressure. An 11 mm trocar was introduced into the abdominal cavity under direct visualization without difficulty. The patient was placed in reverse Trendelenburg position and additional 11 mm trocar was placed the epigastric region and 5 mm trochars were placed the right upper quadrant and right flank regions. The liver was inspected and noted within normal limits. There was some adhesed bowel to the gallbladder. This was freed away sharply using Endo Shears. The gallbladder was then retracted in a dynamic fashion in order to expose the triangle of Calot. The cystic duct was first identified. Its junction to the infundibulum was fully identified. Endoclips placed proximally distally on the cystic duct, and the cystic duct was divided. This was likewise done to the cystic artery. The gallbladder was then freed away from the gallbladder fossa using Bovie electrocautery. The gallbladder was delivered through  the epigastric trocar site using an Endo Catch bag. The gallbladder fossa was inspected and no abnormal bleeding or bile leakage was noted. Surgicel is placed the gallbladder fossa. All fluid and air were then evacuated from the abdominal cavity prior to removal of the trochars.  All wounds were irrigated normal saline. All wounds were injected with 0.5 sent Sensorcaine. The infraumbilical fascia as well as epigastric fascia were reapproximated using 0 Vicryl interrupted sutures. All skin incisions were closed using staples. Betadine ointment and dressed a dressings were applied.  All tape and needle counts were correct at the end of the procedure. Patient was extubated in the operating room and transferred to PACU in stable condition.  Complications:  None  EBL:  Minimal  Specimen:  Gallbladder

## 2012-10-15 NOTE — Anesthesia Preprocedure Evaluation (Signed)
Anesthesia Evaluation  Patient identified by MRN, date of birth, ID band Patient awake    Reviewed: Allergy & Precautions, H&P , NPO status , Patient's Chart, lab work & pertinent test results  Airway Mallampati: II TM Distance: >3 FB     Dental  (+) Teeth Intact   Pulmonary neg pulmonary ROS,  breath sounds clear to auscultation        Cardiovascular negative cardio ROS  Rhythm:Regular Rate:Normal     Neuro/Psych  Headaches,    GI/Hepatic negative GI ROS,   Endo/Other  Hypothyroidism   Renal/GU      Musculoskeletal   Abdominal   Peds  Hematology   Anesthesia Other Findings   Reproductive/Obstetrics                           Anesthesia Physical Anesthesia Plan  ASA: II  Anesthesia Plan: General   Post-op Pain Management:    Induction: Intravenous  Airway Management Planned: Oral ETT  Additional Equipment:   Intra-op Plan:   Post-operative Plan: Extubation in OR  Informed Consent: I have reviewed the patients History and Physical, chart, labs and discussed the procedure including the risks, benefits and alternatives for the proposed anesthesia with the patient or authorized representative who has indicated his/her understanding and acceptance.     Plan Discussed with:   Anesthesia Plan Comments:         Anesthesia Quick Evaluation

## 2012-10-17 ENCOUNTER — Encounter (HOSPITAL_COMMUNITY): Payer: Self-pay | Admitting: General Surgery

## 2012-10-17 ENCOUNTER — Ambulatory Visit: Admit: 2012-10-17 | Payer: BC Managed Care – PPO | Admitting: Internal Medicine

## 2012-10-17 SURGERY — EGD (ESOPHAGOGASTRODUODENOSCOPY)
Anesthesia: Moderate Sedation

## 2012-12-12 ENCOUNTER — Other Ambulatory Visit: Payer: Self-pay | Admitting: Obstetrics & Gynecology

## 2012-12-12 DIAGNOSIS — Z139 Encounter for screening, unspecified: Secondary | ICD-10-CM

## 2012-12-27 ENCOUNTER — Ambulatory Visit (HOSPITAL_COMMUNITY): Payer: BC Managed Care – PPO

## 2012-12-31 ENCOUNTER — Ambulatory Visit (HOSPITAL_COMMUNITY): Payer: BC Managed Care – PPO

## 2013-01-07 ENCOUNTER — Ambulatory Visit (HOSPITAL_COMMUNITY)
Admission: RE | Admit: 2013-01-07 | Discharge: 2013-01-07 | Disposition: A | Discharge status: Home / Self Care | Payer: BC Managed Care – PPO | Source: Ambulatory Visit | Attending: Obstetrics & Gynecology | Admitting: Obstetrics & Gynecology

## 2013-01-07 DIAGNOSIS — Z139 Encounter for screening, unspecified: Secondary | ICD-10-CM

## 2013-01-07 DIAGNOSIS — Z1231 Encounter for screening mammogram for malignant neoplasm of breast: Secondary | ICD-10-CM | POA: Insufficient documentation

## 2013-01-29 ENCOUNTER — Encounter: Payer: Self-pay | Admitting: Obstetrics & Gynecology

## 2013-01-29 ENCOUNTER — Ambulatory Visit (INDEPENDENT_AMBULATORY_CARE_PROVIDER_SITE_OTHER): Payer: BC Managed Care – PPO | Admitting: Obstetrics & Gynecology

## 2013-01-29 ENCOUNTER — Encounter (INDEPENDENT_AMBULATORY_CARE_PROVIDER_SITE_OTHER): Payer: Self-pay

## 2013-01-29 VITALS — BP 88/60 | Ht 64.2 in | Wt 136.0 lb

## 2013-01-29 DIAGNOSIS — E039 Hypothyroidism, unspecified: Secondary | ICD-10-CM

## 2013-01-29 DIAGNOSIS — Z01419 Encounter for gynecological examination (general) (routine) without abnormal findings: Secondary | ICD-10-CM

## 2013-01-29 MED ORDER — ESTRADIOL 0.52 MG/0.87 GM (0.06%) TD GEL: 1.0000 "application " | Freq: Every day | TRANSDERMAL | Status: DC

## 2013-01-29 NOTE — Progress Notes (Signed)
Patient ID: Sandra Carey, female   DOB: 03-Sep-1965, 47 y.o.   MRN: [id number] Subjective:     Sandra Carey is a 47 y.o. female here for a routine exam.  No LMP recorded. Patient has had a hysterectomy. No obstetric history on file. Current complaints: diminished libido.     Gynecologic History No LMP recorded. Patient has had a hysterectomy. Contraception: status post hysterectomy Last Pap: na. Results were: na Last mammogram: 10.2014. Results were: normal  Past Medical History  Diagnosis Date  . Hypothyroid   . Generalized headaches     Past Surgical History  Procedure Laterality Date  . [place] surgery      Dr. Pricilla Holm 8-9 yrs ago  . Vaginal hysterectomy      ovaries remained  . Cholecystectomy N/A 10/15/2012    Procedure: LAPAROSCOPIC CHOLECYSTECTOMY;  Surgeon: Dalia Heading, MD;  Location: [facility];  Service: General;  Laterality: N/A;    OB History   Grav Para Term Preterm Abortions TAB SAB Ect Mult Living                  History   Social History  . Marital Status: Married    Spouse Name: N/A    Number of Children: N/A  . Years of Education: N/A   Social History Main Topics  . Smoking status: Never Smoker   . Smokeless tobacco: None  . Alcohol Use: Yes     Comment: rarely  . Drug Use: No  . Sexual Activity: Yes    Birth Control/ Protection: Surgical   Other Topics Concern  . None   Social History Narrative  . None    History reviewed. No pertinent family history.   Review of Systems  Review of Systems  Constitutional: Negative for fever, chills, weight loss, malaise/fatigue and diaphoresis.  HENT: Negative for hearing loss, ear pain, nosebleeds, congestion, sore throat, neck pain, tinnitus and ear discharge.   Eyes: Negative for blurred vision, double vision, photophobia, pain, discharge and redness.  Respiratory: Negative for cough, hemoptysis, sputum production, shortness of breath, wheezing and stridor.   Cardiovascular: Negative for chest  pain, palpitations, orthopnea, claudication, leg swelling and PND.  Gastrointestinal: negative for abdominal pain. Negative for heartburn, nausea, vomiting, diarrhea, constipation, blood in stool and melena.  Genitourinary: Negative for dysuria, urgency, frequency, hematuria and flank pain.  Musculoskeletal: Negative for myalgias, back pain, joint pain and falls.  Skin: Negative for itching and rash.  Neurological: Negative for dizziness, tingling, tremors, sensory change, speech change, focal weakness, seizures, loss of consciousness, weakness and headaches.  Endo/Heme/Allergies: Negative for environmental allergies and polydipsia. Does not bruise/bleed easily.  Psychiatric/Behavioral: Negative for depression, suicidal ideas, hallucinations, memory loss and substance abuse. The patient is not nervous/anxious and does not have insomnia.        Objective:    Physical Exam  Vitals reviewed. Constitutional: She is oriented to person, place, and time. She appears well-developed and well-nourished.  HENT:  Head: Normocephalic and atraumatic.        Right Ear: External ear normal.  Left Ear: External ear normal.  Nose: Nose normal.  Mouth/Throat: Oropharynx is clear and moist.  Eyes: Conjunctivae and EOM are normal. Pupils are equal, round, and reactive to light. Right eye exhibits no discharge. Left eye exhibits no discharge. No scleral icterus.  Neck: Normal range of motion. Neck supple. No tracheal deviation present. No thyromegaly present.  Cardiovascular: Normal rate, regular rhythm, normal heart sounds and intact distal pulses.  Exam reveals no gallop and no friction rub.   No murmur heard. Respiratory: Effort normal and breath sounds normal. No respiratory distress. She has no wheezes. She has no rales. She exhibits no tenderness.  GI: Soft. Bowel sounds are normal. She exhibits no distension and no mass. There is no tenderness. There is no rebound and no guarding.  Genitourinary:   Breasts no masses skin changes or nipple changes bilaterally      Vulva is normal without lesions Vagina is pink moist without discharge Cervix normal in appearance and pap is done Uterus is normal size shape and contour Adnexa is negative with normal sized ovaries    Musculoskeletal: Normal range of motion. She exhibits no edema and no tenderness.  Neurological: She is alert and oriented to person, place, and time. She has normal reflexes. She displays normal reflexes. No cranial nerve deficit. She exhibits normal muscle tone. Coordination normal.  Skin: Skin is warm and dry. No rash noted. No erythema. No pallor.  Psychiatric: She has a normal mood and affect. Her behavior is normal. Judgment and thought content normal.       Assessment:    Healthy female exam.    Plan:    Hormone replacement therapy: hormone replacement therapy: elestrin and elestrin samples given. Follow up in: 1 year.

## 2013-01-30 ENCOUNTER — Telehealth: Payer: Self-pay | Admitting: Obstetrics & Gynecology

## 2013-01-30 MED ORDER — LEVOTHYROXINE SODIUM 50 MCG PO TABS: 50.0000 ug | ORAL_TABLET | Freq: Every day | ORAL | Status: DC

## 2013-01-30 NOTE — Telephone Encounter (Signed)
Pt informed thyroid normal and same dosage Synthroid ordered with year refill.

## 2013-01-30 NOTE — Telephone Encounter (Signed)
See result note.  

## 2013-02-11 ENCOUNTER — Telehealth: Payer: Self-pay | Admitting: Obstetrics & Gynecology

## 2013-02-11 DIAGNOSIS — G35 Multiple sclerosis: Secondary | ICD-10-CM

## 2013-02-11 NOTE — Telephone Encounter (Signed)
Pt informed referral done, if pt does not hear from [facility] for an appt to please call our office back.

## 2013-02-11 NOTE — Telephone Encounter (Signed)
Pt states given elestrin samples at last appt, has been having headaches since started is this a common side effect. Pt informed can have headaches starting the elestrin should improve with time.  1.  Pt states is elestrin and estradial gel similar?  2. Requesting referral to [facility] in [place].

## 2013-02-11 NOTE — Telephone Encounter (Signed)
1.  I answered question regarding her estrogen gel.  2.  Please refer over to [facility], she has seen them before but it has been over 3 years and call pt with the info, whether they are going to call her etc.  Thanks  Dr Despina Hidden

## 2013-02-14 ENCOUNTER — Ambulatory Visit (INDEPENDENT_AMBULATORY_CARE_PROVIDER_SITE_OTHER): Payer: BC Managed Care – PPO | Admitting: Neurology

## 2013-02-14 ENCOUNTER — Encounter: Payer: Self-pay | Admitting: Neurology

## 2013-02-14 VITALS — BP 101/69 | HR 78 | Ht 62.0 in | Wt 136.0 lb

## 2013-02-14 DIAGNOSIS — R51 Headache: Secondary | ICD-10-CM

## 2013-02-14 DIAGNOSIS — H538 Other visual disturbances: Secondary | ICD-10-CM

## 2013-02-14 DIAGNOSIS — R1011 Right upper quadrant pain: Secondary | ICD-10-CM

## 2013-02-14 DIAGNOSIS — E039 Hypothyroidism, unspecified: Secondary | ICD-10-CM

## 2013-02-14 DIAGNOSIS — R519 Headache, unspecified: Secondary | ICD-10-CM

## 2013-02-14 NOTE — Progress Notes (Signed)
[facility]  PATIENT: Sandra Carey DOB: 09/14/1965  HISTORICAL  Sandra Carey is a 47 years old right-handed Caucasian female, referred by her primary care physician Dr. Turner Daniels for evaluation of constellation of complaints  I saw her previously in November 2010, with complains of chronic headaches, diffuse body aching pain, difficulty concentrate, short-term memory trouble, sharp shooting pain in the right foot, depression, difficulty sleeping, changing energy level, disinterested in past activity, she had extensive evaluation, including MRI of the cervical spine, showed endplate marrow edema surrounding C5 and 6 this, circumferential disc bulging, multilevel mild cervical degenerative disc disease, but without significant canal or foraminal stenosis, MRI of the brain in 2010 showed no acute lesions, mild scattered foci of flair, and T2 hyperdensity, there was no significant change compared to the previous getting in 2005  Since that evaluation, she was evaluated by different physicians, including local Neurologist,  she reported more extensive evaluations including laboratory evaluations, and also repeat MRI of the brain, most recent one was in May 2014, which continued to demonstrate mild scattered T2, flair hyperdensity lesion, no change compared to previous scan in 2010, MRI of the cervical spine demonstrate multilevel mild degenerative disc disease, no cord signal change, no significant canal, or foraminal stenosis,  She came in again with constellation of complaints, episodes of sudden visual loss, frequent  flashing light in her left visual field, lasting for one day,   majority of the time followed by a headaches,  She also complains of unsteady gait, drunklike sensation, she reported a strong family history of autoimmune disease, maternal grandfather suffered scleroderma, rheumatoid arthritis, mother suffered arthritis  She was worried about the possibility of  multiple sclerosis, based on mild abnormal MRI of the brain, reported extensive evaluation by previous neurologists, but was not offered any treatment, this including laboratory evaluations, visual evoked potential, I do not have the formal report  Based on her history, fairly normal neurological examination, and mild abnormal stable MRI findings, I think the mild scattered hyperdensity lesion are most related to her history of migraine, she was apparently unsatisfied about the explanation, very frustrated about her symptoms.  I offered further evaluation such as more extensive laboratory evaluation, even umbar puncture, referral to tertiary center such as [facility], or [facility], she left without further explanation, or followup plan.   REVIEW OF SYSTEMS: Full 14 system review of systems performed and notable only for weight gain, trouble swallowing, blurry vision, loss of vision, shortness of breath, diarrhea, constipation, urination problem, incontinence, feeling cold, achy muscles, memory loss, confusion, headaches, numbness, weakness, difficulty swelling, dizziness, tremor, insomnia, restless left, dropping things, had a left, trouble walking, ALLERGIES: Allergies  Allergen Reactions  . Sulfa Antibiotics Other (See Comments)    Dizziness. Causes patient to pass out.     HOME MEDICATIONS: Outpatient Prescriptions Prior to Visit  Medication Sig Dispense Refill  . Estradiol 0.52 MG/0.87 GM (0.06%) GEL Apply 1 application topically daily.  1 Bottle  11  . ibuprofen (ADVIL,MOTRIN) 200 MG tablet Take 200 mg by mouth every 6 (six) hours as needed for pain.      Marland Kitchen levothyroxine (SYNTHROID, LEVOTHROID) 50 MCG tablet Take 1 tablet (50 mcg total) by mouth daily before breakfast.  30 tablet  11  . HYDROcodone-acetaminophen (NORCO) 5-325 MG per tablet Take 1-2 tablets by mouth every 4 (four) hours as needed for pain.  50 tablet  0  . hyoscyamine (LEVSIN SL) 0.125 MG SL tablet Place 1 tablet (0.125  mg  total) under the tongue every 4 (four) hours as needed for cramping.  30 tablet  0  . Linoleic Acid Conjugated (CLA PO) Take 2 tablets by mouth daily.      . Omega-3 Fatty Acids (FISH OIL TRIPLE STRENGTH) 1400 MG CAPS Take 1,400 mg by mouth daily.      Bertram Gala Glycol-Propyl Glycol (SYSTANE OP) Place 1 drop into both eyes daily as needed (Dry Eyes).       No facility-administered medications prior to visit.    PAST MEDICAL HISTORY: Past Medical History  Diagnosis Date  . Hypothyroid   . Generalized headaches   . HA (headache)     PAST SURGICAL HISTORY: Past Surgical History  Procedure Laterality Date  . Bunions surgery      Dr. Pricilla Holm 8-9 yrs ago  . Vaginal hysterectomy      ovaries remained  . Cholecystectomy N/A 10/15/2012    Procedure: LAPAROSCOPIC CHOLECYSTECTOMY;  Surgeon: Dalia Heading, MD;  Location: [facility];  Service: General;  Laterality: N/A;    FAMILY HISTORY: History reviewed. No pertinent family history.  SOCIAL HISTORY:  History   Social History  . Marital Status: Married    Spouse Name: Cindee Lame    Number of Children: 2  . Years of Education: college   Occupational History    [employer], Print production planner   Social History Main Topics  . Smoking status: Never Smoker   . Smokeless tobacco: Never Used  . Alcohol Use: Yes     Comment: rarely  . Drug Use: No  . Sexual Activity: Yes    Birth Control/ Protection: Surgical   Other Topics Concern  . Not on file   Social History Narrative   Patient lives at home with her husband [name])    Education- College   Caffeine- three times a week.   Right handed.     PHYSICAL EXAM   Filed Vitals:   02/14/13 0856  BP: 101/69  Pulse: 78  Height: 5\' 2"  (1.575 m)  Weight: 136 lb (61.689 kg)   Body mass index is 24.87 kg/(m^2).   Generalized: In no acute distress  Neck: Supple, no carotid bruits   Cardiac: Regular rate rhythm  Pulmonary: Clear to auscultation bilaterally  Musculoskeletal:  No deformity  Neurological examination  Mentation: Alert oriented to time, place, history taking, and causual conversation  Cranial nerve II-XII: Pupils were equal round reactive to light extraocular movements were full, visual field were full on confrontational test. facial sensation and strength were normal. hearing was intact to finger rubbing bilaterally. Uvula tongue midline.  head turning and shoulder shrug and were normal and symmetric.Tongue protrusion into cheek strength was normal.  Motor: normal tone, bulk and strength.  Sensory: Intact to fine touch, pinprick, preserved vibratory sensation, and proprioception at toes.  Coordination: Normal finger to nose, heel-to-shin bilaterally there was no truncal ataxia  Gait: Rising up from seated position without assistance, normal stance, without trunk ataxia, moderate stride, good arm swing, smooth turning, deliberate effort when asked her to walk on tiptoes or heels  Romberg signs: Negative  Deep tendon reflexes: Brachioradialis 2/2, biceps 2/2, triceps 2/2, patellar 2/2, Achilles 2/2, plantar responses were flexor bilaterally.   DIAGNOSTIC DATA (LABS, IMAGING, TESTING) - I reviewed patient records, labs, notes, testing and imaging myself where available.  Lab Results  Component Value Date   WBC 6.6 10/11/2012   HGB 14.0 10/11/2012   HCT 39.6 10/11/2012   MCV 92.5 10/11/2012  PLT 184 10/11/2012      Component Value Date/Time   NA 138 10/11/2012 1345   K 4.2 10/11/2012 1345   CL 101 10/11/2012 1345   CO2 31 10/11/2012 1345   GLUCOSE 88 10/11/2012 1345   BUN 12 10/11/2012 1345   CREATININE 0.68 10/11/2012 1345   CALCIUM 10.1 10/11/2012 1345   PROT 7.4 10/11/2012 1345   ALBUMIN 4.2 10/11/2012 1345   AST 16 10/11/2012 1345   ALT 11 10/11/2012 1345   ALKPHOS 59 10/11/2012 1345   BILITOT 0.5 10/11/2012 1345   GFRNONAA >90 10/11/2012 1345   GFRAA >90 10/11/2012 1345   Lab Results  Component Value Date   TSH 0.755 01/29/2013       ASSESSMENT AND PLAN   47 years old Caucasian female, with complicated constellation of complaints, detailed above, essentially normal neurological examination, deliberate effort during neurological examination, fairly stable mild abnormal MRI of the brain, since 2005, there was no history, imaging findings to suggest the neurological event disseminated in time and space to support a diagnosis of multiple sclerosis at this point,  Her history most suggestive of complicated migraine headaches, no follow up plan.  She left room without following the suggestions of symptomatic treatment, getting record from previously evaluation, further evaluation, or refer to tertiary center.  There was breach of physician and patient relationship during interaction, I will not see her again in my clinic.      Levert Feinstein, M.D. Ph.D.  [facility] [address] [place], [place] [address] [phone]

## 2013-04-07 ENCOUNTER — Other Ambulatory Visit: Payer: Self-pay | Admitting: Obstetrics & Gynecology

## 2013-12-12 ENCOUNTER — Other Ambulatory Visit: Payer: Self-pay | Admitting: *Deleted

## 2013-12-13 ENCOUNTER — Telehealth: Payer: Self-pay | Admitting: Obstetrics & Gynecology

## 2013-12-13 MED ORDER — TOPIRAMATE 25 MG PO TABS: ORAL_TABLET | ORAL | Status: DC

## 2013-12-13 NOTE — Telephone Encounter (Signed)
RX for Topamax sent to [facility] in [place].

## 2013-12-23 ENCOUNTER — Other Ambulatory Visit: Payer: Self-pay | Admitting: Obstetrics & Gynecology

## 2013-12-23 DIAGNOSIS — Z139 Encounter for screening, unspecified: Secondary | ICD-10-CM

## 2014-01-08 ENCOUNTER — Ambulatory Visit (HOSPITAL_COMMUNITY)
Admission: RE | Admit: 2014-01-08 | Discharge: 2014-01-08 | Disposition: A | Discharge status: Home / Self Care | Payer: BC Managed Care – PPO | Source: Ambulatory Visit | Attending: Obstetrics & Gynecology | Admitting: Obstetrics & Gynecology

## 2014-01-08 DIAGNOSIS — Z1231 Encounter for screening mammogram for malignant neoplasm of breast: Secondary | ICD-10-CM | POA: Diagnosis present

## 2014-01-08 DIAGNOSIS — Z139 Encounter for screening, unspecified: Secondary | ICD-10-CM

## 2014-02-04 ENCOUNTER — Ambulatory Visit (INDEPENDENT_AMBULATORY_CARE_PROVIDER_SITE_OTHER): Payer: BC Managed Care – PPO | Admitting: Obstetrics & Gynecology

## 2014-02-04 ENCOUNTER — Encounter: Payer: Self-pay | Admitting: Obstetrics & Gynecology

## 2014-02-04 VITALS — BP 100/60 | Ht 64.4 in | Wt 129.4 lb

## 2014-02-04 DIAGNOSIS — Z01419 Encounter for gynecological examination (general) (routine) without abnormal findings: Secondary | ICD-10-CM | POA: Diagnosis not present

## 2014-02-04 MED ORDER — IBUPROFEN 800 MG PO TABS: 800.0000 mg | ORAL_TABLET | Freq: Three times a day (TID) | ORAL | Status: DC | PRN

## 2014-02-04 MED ORDER — TOPIRAMATE 50 MG PO TABS: ORAL_TABLET | ORAL | Status: DC

## 2014-02-04 MED ORDER — METRONIDAZOLE 0.75 % VA GEL: VAGINAL | Status: DC

## 2014-02-04 NOTE — Progress Notes (Signed)
Patient ID: Sandra Carey, female   DOB: 06/21/1965, 48 y.o.   MRN: [id number] Subjective:     Sandra Carey is a 48 y.o. female here for a routine exam.  No LMP recorded. Patient has had a hysterectomy. No obstetric history on file. Birth Control Method:  TVH Menstrual Calendar(currently): amenorrheic  Current complaints: unpleasant discharge.   Current acute medical issues:  MS stable   Recent Gynecologic History No LMP recorded. Patient has had a hysterectomy. Last Pap: 2012,  normal Last mammogram: 2015,  normal  Past Medical History  Diagnosis Date  . Hypothyroid   . Generalized headaches   . MS (multiple sclerosis)   . HA (headache)   . Depression     Past Surgical History  Procedure Laterality Date  . Bunions surgery      Dr. Pricilla Holmucker 8-9 yrs ago  . Vaginal hysterectomy      ovaries remained  . Cholecystectomy N/A 10/15/2012    Procedure: LAPAROSCOPIC CHOLECYSTECTOMY;  Surgeon: Dalia HeadingMark A Jenkins, MD;  Location: [facility];  Service: General;  Laterality: N/A;    OB History    No data available      History   Social History  . Marital Status: Married    Spouse Name: Cindee Lameete  . Number of Children: 2  . Years of Education: college   Occupational History  .      [employer]   Social History Main Topics  . Smoking status: Former Games developermoker  . Smokeless tobacco: Never Used  . Alcohol Use: 0.0 oz/week    0 Standard drinks or equivalent per week     Comment: rarely  . Drug Use: No  . Sexual Activity: Yes    Birth Control/ Protection: Surgical   Other Topics Concern  . None   Social History Narrative   Patient lives at home with her husband [name]([place])    Education- College   Caffeine- three times a week.   Right handed.    History reviewed. No pertinent family history.   Current outpatient prescriptions:  .  ibuprofen (ADVIL,MOTRIN) 200 MG tablet, Take 200 mg by mouth every 6 (six) hours as needed for pain., Disp: , Rfl:  .  topiramate (TOPAMAX) 50 MG  tablet, Take 1 tablet daily, Disp: 30 tablet, Rfl: 11 .  Estradiol 0.52 MG/0.87 GM (0.06%) GEL, Apply 1 application topically daily., Disp: 1 Bottle, Rfl: 11 .  ibuprofen (ADVIL,MOTRIN) 800 MG tablet, Take 1 tablet (800 mg total) by mouth every 8 (eight) hours as needed., Disp: 30 tablet, Rfl: 11 .  levothyroxine (SYNTHROID, LEVOTHROID) 50 MCG tablet, TAKE ONE TABLET BY MOUTH ONCE DAILY BEFORE  BREADFAST, Disp: 30 tablet, Rfl: 11 .  metroNIDAZOLE (METROGEL VAGINAL) 0.75 % vaginal gel, Nightly x 5 nights, Disp: 70 g, Rfl: 0  Review of Systems  Review of Systems  Constitutional: Negative for fever, chills, weight loss, malaise/fatigue and diaphoresis.  HENT: Negative for hearing loss, ear pain, nosebleeds, congestion, sore throat, neck pain, tinnitus and ear discharge.   Eyes: Negative for blurred vision, double vision, photophobia, pain, discharge and redness.  Respiratory: Negative for cough, hemoptysis, sputum production, shortness of breath, wheezing and stridor.   Cardiovascular: Negative for chest pain, palpitations, orthopnea, claudication, leg swelling and PND.  Gastrointestinal: negative for abdominal pain. Negative for heartburn, nausea, vomiting, diarrhea, constipation, blood in stool and melena.  Genitourinary: Negative for dysuria, urgency, frequency, hematuria and flank pain.  Musculoskeletal: Negative for myalgias, back pain, joint pain and falls.  Skin: Negative for itching and rash.  Neurological: Negative for dizziness, tingling, tremors, sensory change, speech change, focal weakness, seizures, loss of consciousness, weakness and headaches.  Endo/Heme/Allergies: Negative for environmental allergies and polydipsia. Does not bruise/bleed easily.  Psychiatric/Behavioral: Negative for depression, suicidal ideas, hallucinations, memory loss and substance abuse. The patient is not nervous/anxious and does not have insomnia.        Objective:  Blood pressure 100/60, height 5' 4.4"  (1.636 m), weight 129 lb 6.4 oz (58.695 kg).   Physical Exam  Vitals reviewed. Constitutional: She is oriented to person, place, and time. She appears well-developed and well-nourished.  HENT:  Head: Normocephalic and atraumatic.        Right Ear: External ear normal.  Left Ear: External ear normal.  Nose: Nose normal.  Mouth/Throat: Oropharynx is clear and moist.  Eyes: Conjunctivae and EOM are normal. Pupils are equal, round, and reactive to light. Right eye exhibits no discharge. Left eye exhibits no discharge. No scleral icterus.  Neck: Normal range of motion. Neck supple. No tracheal deviation present. No thyromegaly present.  Cardiovascular: Normal rate, regular rhythm, normal heart sounds and intact distal pulses.  Exam reveals no gallop and no friction rub.   No murmur heard. Respiratory: Effort normal and breath sounds normal. No respiratory distress. She has no wheezes. She has no rales. She exhibits no tenderness.  GI: Soft. Bowel sounds are normal. She exhibits no distension and no mass. There is no tenderness. There is no rebound and no guarding.  Genitourinary:  Breasts no masses skin changes or nipple changes bilaterally      Vulva is normal without lesions Vagina is pink moist without discharge Cervix absent Uterus is surgically absent Adnexa is negative  Musculoskeletal: Normal range of motion. She exhibits no edema and no tenderness.  Neurological: She is alert and oriented to person, place, and time. She has normal reflexes. She displays normal reflexes. No cranial nerve deficit. She exhibits normal muscle tone. Coordination normal.  Skin: Skin is warm and dry. No rash noted. No erythema. No pallor.  Psychiatric: She has a normal mood and affect. Her behavior is normal. Judgment and thought content normal.       Assessment:     Normal gyn exam .    Plan:    Mammogram ordered. Follow up in: 1 year. metrogel, topamax, ibuprofen   synthroid

## 2014-02-09 ENCOUNTER — Other Ambulatory Visit: Payer: Self-pay | Admitting: Obstetrics & Gynecology

## 2014-10-31 ENCOUNTER — Encounter (INDEPENDENT_AMBULATORY_CARE_PROVIDER_SITE_OTHER): Payer: Self-pay | Admitting: *Deleted

## 2014-11-05 ENCOUNTER — Encounter (INDEPENDENT_AMBULATORY_CARE_PROVIDER_SITE_OTHER): Payer: Self-pay | Admitting: Internal Medicine

## 2014-11-05 ENCOUNTER — Ambulatory Visit (INDEPENDENT_AMBULATORY_CARE_PROVIDER_SITE_OTHER): Payer: BLUE CROSS/BLUE SHIELD | Admitting: Internal Medicine

## 2014-11-05 VITALS — BP 94/56 | HR 76 | Temp 98.1°F | Ht 65.0 in | Wt 128.8 lb

## 2014-11-05 DIAGNOSIS — K921 Melena: Secondary | ICD-10-CM | POA: Diagnosis not present

## 2014-11-05 DIAGNOSIS — K5909 Other constipation: Secondary | ICD-10-CM

## 2014-11-05 LAB — CBC WITH DIFFERENTIAL/PLATELET
BASOS PCT: 1 % (ref 0–1)
Basophils Absolute: 0 10*3/uL (ref 0.0–0.1)
Eosinophils Absolute: 0.1 10*3/uL (ref 0.0–0.7)
Eosinophils Relative: 3 % (ref 0–5)
HEMATOCRIT: 40.2 % (ref 36.0–46.0)
HEMOGLOBIN: 14.1 g/dL (ref 12.0–15.0)
LYMPHS ABS: 1.6 10*3/uL (ref 0.7–4.0)
LYMPHS PCT: 40 % (ref 12–46)
MCH: 32.4 pg (ref 26.0–34.0)
MCHC: 35.1 g/dL (ref 30.0–36.0)
MCV: 92.4 fL (ref 78.0–100.0)
MONO ABS: 0.3 10*3/uL (ref 0.1–1.0)
MONOS PCT: 7 % (ref 3–12)
MPV: 10.2 fL (ref 8.6–12.4)
NEUTROS ABS: 2 10*3/uL (ref 1.7–7.7)
Neutrophils Relative %: 49 % (ref 43–77)
Platelets: 175 10*3/uL (ref 150–400)
RBC: 4.35 MIL/uL (ref 3.87–5.11)
RDW: 13.2 % (ref 11.5–15.5)
WBC: 4 10*3/uL (ref 4.0–10.5)

## 2014-11-05 NOTE — Patient Instructions (Addendum)
Stool cards x 3 . CBC today.  Amitiaz samples given to patient.

## 2014-11-05 NOTE — Progress Notes (Signed)
Subjective:    Patient ID: Sandra Carey, female    DOB: 1965-09-07, 49 y.o.   MRN: [id number]  HPI Here today for f/u. She tells me she is constipated. She has been using Miralax and sometimes this does not help. She uses Miralax every day. She was taking a stool softener but she became incontinent. She stopped the Stool softener. Hx of constipation.  After her gallbladder was removed her constipation was better but about 6 months she has gradually became constipated.  Appetite is good. She has lost about 4 pounds since her last visit.  She has increased her fiber. She is drinking lots of water. She has cut back on sugar. She says her stools are black every day. She has to strain to have a BM. She usually has a BM every other day with the Miralax. No Pepto Bismol. Just before coming in exam room she had a BM and she says it was black.  She denies having any abdominal pain.  Her last colonoscopy was ? Less than 10 yrs but I do not see it in our system.   10/15/2012 Lap. Cholecystectomy for chronic cholecystitis by Dr. Lovell Sheehan. She also tells me today she has a hx of ulcerative and has not had any problems in years.    Review of Systems Past Medical History  Diagnosis Date  . Hypothyroid   . Generalized headaches   . MS (multiple sclerosis)   . HA (headache)   . Depression     Past Surgical History  Procedure Laterality Date  . Bunions surgery      Dr. Pricilla Holm 8-9 yrs ago  . Vaginal hysterectomy      ovaries remained  . Cholecystectomy N/A 10/15/2012    Procedure: LAPAROSCOPIC CHOLECYSTECTOMY;  Surgeon: Dalia Heading, MD;  Location: [facility];  Service: General;  Laterality: N/A;    Allergies  Allergen Reactions  . Sulfa Antibiotics Other (See Comments)    Dizziness. Causes patient to pass out.     Current Outpatient Prescriptions on File Prior to Visit  Medication Sig Dispense Refill  . Estradiol 0.52 MG/0.87 GM (0.06%) GEL Apply 1 application topically daily. 1 Bottle 11    . ibuprofen (ADVIL,MOTRIN) 200 MG tablet Take 200 mg by mouth every 6 (six) hours as needed for pain.    Marland Kitchen ibuprofen (ADVIL,MOTRIN) 800 MG tablet Take 1 tablet (800 mg total) by mouth every 8 (eight) hours as needed. 30 tablet 11  . topiramate (TOPAMAX) 50 MG tablet Take 1 tablet daily 30 tablet 11  . levothyroxine (SYNTHROID, LEVOTHROID) 50 MCG tablet TAKE ONE TABLET BY MOUTH ONCE DAILY BEFORE  BREADFAST (Patient not taking: Reported on 11/05/2014) 30 tablet 11  . metroNIDAZOLE (METROGEL VAGINAL) 0.75 % vaginal gel Nightly x 5 nights (Patient not taking: Reported on 11/05/2014) 70 g 0   No current facility-administered medications on file prior to visit.        Objective:   Physical Exam Blood pressure 94/56, pulse 76, temperature 98.1 F (36.7 C), height 5\' 5"  (1.651 m), weight 128 lb 12.8 oz (58.423 kg). Alert and oriented. Skin warm and dry. Oral mucosa is moist.   . Sclera anicteric, conjunctivae is pink. Thyroid not enlarged. No cervical lymphadenopathy. Lungs clear. Heart regular rate and rhythm.  Abdomen is soft. Bowel sounds are positive. No hepatomegaly. No abdominal masses felt. No tenderness.  No edema to lower extremities. Stool brown and guaiac negative.   Developer: [id number] ( 01/2018) Card: Lot [id number] (  12/18)      Assessment & Plan:  Constipation. Am going to try her on Amitiza and she how she does. Stool cards x 3 will be sent home with her. CBC today Patient will try to find out what hospital she had her last colonoscopy.

## 2014-11-06 ENCOUNTER — Telehealth (INDEPENDENT_AMBULATORY_CARE_PROVIDER_SITE_OTHER): Payer: Self-pay | Admitting: *Deleted

## 2014-11-06 NOTE — Telephone Encounter (Signed)
She said she was asked to call back about when her last TCS was from her visit yesterday with you.  She said she had 1 when she was 15 in [facility] D[facility] with UC  Her name was Sandra Carey  She had another one at 69 -49 years old in [place] and her name was [name]--was having problems  She has not had one in last 10 years  Control UC with diet

## 2014-11-10 ENCOUNTER — Telehealth (INDEPENDENT_AMBULATORY_CARE_PROVIDER_SITE_OTHER): Payer: Self-pay | Admitting: *Deleted

## 2014-11-10 ENCOUNTER — Other Ambulatory Visit: Payer: Self-pay | Admitting: Obstetrics & Gynecology

## 2014-11-10 NOTE — Telephone Encounter (Signed)
   Diagnosis:    Result(s)   Card 1: Negative:     Card 2:Negative:   Card 3: Negative:    Completed by: Earsel Shouse,LPN   HEMOCCULT SENSA DEVELOPER: LOT#:  [id number] EXPIRATION DATE: 9-17   HEMOCCULT SENSA CARD:  LOT#:  [id number] EXPIRATION DATE:07/18   CARD CONTROL RESULTS:  POSITIVE: Positive NEGATIVE: Negative    ADDITIONAL COMMENTS: Forwarded to Terri for review.  

## 2014-11-14 NOTE — Telephone Encounter (Signed)
Results given to patient. All are normal.  

## 2014-11-14 NOTE — Telephone Encounter (Signed)
When she has OV in November will discuss colonoscopy.

## 2015-01-01 ENCOUNTER — Other Ambulatory Visit: Payer: Self-pay | Admitting: Obstetrics & Gynecology

## 2015-01-01 DIAGNOSIS — Z1231 Encounter for screening mammogram for malignant neoplasm of breast: Secondary | ICD-10-CM

## 2015-01-12 ENCOUNTER — Ambulatory Visit (HOSPITAL_COMMUNITY)
Admission: RE | Admit: 2015-01-12 | Discharge: 2015-01-12 | Disposition: A | Discharge status: Home / Self Care | Payer: BLUE CROSS/BLUE SHIELD | Source: Ambulatory Visit | Attending: Obstetrics & Gynecology | Admitting: Obstetrics & Gynecology

## 2015-01-12 DIAGNOSIS — Z1231 Encounter for screening mammogram for malignant neoplasm of breast: Secondary | ICD-10-CM | POA: Diagnosis present

## 2015-02-05 ENCOUNTER — Ambulatory Visit (INDEPENDENT_AMBULATORY_CARE_PROVIDER_SITE_OTHER): Payer: BLUE CROSS/BLUE SHIELD | Admitting: Internal Medicine

## 2015-06-03 ENCOUNTER — Telehealth: Payer: Self-pay | Admitting: Obstetrics & Gynecology

## 2015-06-03 MED ORDER — TOPIRAMATE 50 MG PO TABS: ORAL_TABLET | ORAL | Status: DC

## 2015-06-03 NOTE — Telephone Encounter (Signed)
Will refill topamax

## 2015-06-12 ENCOUNTER — Ambulatory Visit (INDEPENDENT_AMBULATORY_CARE_PROVIDER_SITE_OTHER): Payer: BLUE CROSS/BLUE SHIELD | Admitting: Obstetrics & Gynecology

## 2015-06-12 ENCOUNTER — Encounter: Payer: Self-pay | Admitting: Obstetrics & Gynecology

## 2015-06-12 VITALS — BP 100/60 | HR 72 | Ht 64.4 in | Wt 133.0 lb

## 2015-06-12 DIAGNOSIS — Z01419 Encounter for gynecological examination (general) (routine) without abnormal findings: Secondary | ICD-10-CM | POA: Diagnosis not present

## 2015-06-12 DIAGNOSIS — Z90710 Acquired absence of both cervix and uterus: Secondary | ICD-10-CM

## 2015-06-12 MED ORDER — MIRABEGRON ER 50 MG PO TB24: 50.0000 mg | ORAL_TABLET | Freq: Every day | ORAL | Status: DC

## 2015-06-12 MED ORDER — IBUPROFEN 800 MG PO TABS: 800.0000 mg | ORAL_TABLET | Freq: Three times a day (TID) | ORAL | Status: DC | PRN

## 2015-06-12 NOTE — Progress Notes (Signed)
Patient ID: Sandra Carey, female   DOB: 08/27/1965, 50 y.o.   MRN: [id number] Subjective:     Sandra Carey is a 50 y.o. female here for a routine exam.  No LMP recorded. Patient has had a hysterectomy. No obstetric history on file. Birth Control Method:  Hysterectomy Menstrual Calendar(currently): Amenorrheic  Current complaints: Urge incontinence.   Current acute medical issues:  Possible multiple sclerosis   Recent Gynecologic History No LMP recorded. Patient has had a hysterectomy. Last Pap: Prior hysterectomy benign,   Last mammogram: 2016,  normal  Past Medical History  Diagnosis Date  . Hypothyroid   . Generalized headaches   . MS (multiple sclerosis) (HCC)   . HA (headache)   . Depression     Past Surgical History  Procedure Laterality Date  . Bunions surgery      Dr. Pricilla Holmucker 8-9 yrs ago  . Vaginal hysterectomy      ovaries remained  . Cholecystectomy N/A 10/15/2012    Procedure: LAPAROSCOPIC CHOLECYSTECTOMY;  Surgeon: Dalia HeadingMark A Jenkins, MD;  Location: [facility];  Service: General;  Laterality: N/A;    OB History    No data available      Social History   Social History  . Marital Status: Married    Spouse Name: Cindee Lameete  . Number of Children: 2  . Years of Education: college   Occupational History  .      [employer]   Social History Main Topics  . Smoking status: Former Games developermoker  . Smokeless tobacco: Never Used  . Alcohol Use: 0.0 oz/week    0 Standard drinks or equivalent per week     Comment: rarely  . Drug Use: No  . Sexual Activity: Yes    Birth Control/ Protection: Surgical   Other Topics Concern  . None   Social History Narrative   Patient lives at home with her husband [name]([place])    Education- College   Caffeine- three times a week.   Right handed.    History reviewed. No pertinent family history.   Current outpatient prescriptions:  .  topiramate (TOPAMAX) 50 MG tablet, Take 1 tablet daily, Disp: 30 tablet, Rfl: 11 .  mirabegron ER  (MYRBETRIQ) 50 MG TB24 tablet, Take 1 tablet (50 mg total) by mouth daily., Disp: 30 tablet, Rfl: 11  Review of Systems  Review of Systems  Constitutional: Negative for fever, chills, weight loss, malaise/fatigue and diaphoresis.  HENT: Negative for hearing loss, ear pain, nosebleeds, congestion, sore throat, neck pain, tinnitus and ear discharge.   Eyes: Negative for blurred vision, double vision, photophobia, pain, discharge and redness.  Respiratory: Negative for cough, hemoptysis, sputum production, shortness of breath, wheezing and stridor.   Cardiovascular: Negative for chest pain, palpitations, orthopnea, claudication, leg swelling and PND.  Gastrointestinal: negative for abdominal pain. Negative for heartburn, nausea, vomiting, diarrhea, constipation, blood in stool and melena.  Genitourinary: Negative for dysuria, urgency, frequency, hematuria and flank pain.  Musculoskeletal: Negative for myalgias, back pain, joint pain and falls.  Skin: Negative for itching and rash.  Neurological: Negative for dizziness, tingling, tremors, sensory change, speech change, focal weakness, seizures, loss of consciousness, weakness and headaches.  Endo/Heme/Allergies: Negative for environmental allergies and polydipsia. Does not bruise/bleed easily.  Psychiatric/Behavioral: Negative for depression, suicidal ideas, hallucinations, memory loss and substance abuse. The patient is not nervous/anxious and does not have insomnia.        Objective:  Blood pressure 100/60, pulse 72, height 5' 4.4" (1.636 m),  weight 133 lb (60.328 kg).   Physical Exam  Vitals reviewed. Constitutional: She is oriented to person, place, and time. She appears well-developed and well-nourished.  HENT:  Head: Normocephalic and atraumatic.        Right Ear: External ear normal.  Left Ear: External ear normal.  Nose: Nose normal.  Mouth/Throat: Oropharynx is clear and moist.  Eyes: Conjunctivae and EOM are normal. Pupils are  equal, round, and reactive to light. Right eye exhibits no discharge. Left eye exhibits no discharge. No scleral icterus.  Neck: Normal range of motion. Neck supple. No tracheal deviation present. No thyromegaly present.  Cardiovascular: Normal rate, regular rhythm, normal heart sounds and intact distal pulses.  Exam reveals no gallop and no friction rub.   No murmur heard. Respiratory: Effort normal and breath sounds normal. No respiratory distress. She has no wheezes. She has no rales. She exhibits no tenderness.  GI: Soft. Bowel sounds are normal. She exhibits no distension and no mass. There is no tenderness. There is no rebound and no guarding.  Genitourinary:  Breasts no masses skin changes or nipple changes bilaterally      Vulva is normal without lesions Vagina is pink moist without discharge Cervix absent  Uterus is absent Adnexa is negative {Rectal    hemoccult negative, normal tone, no masses Musculoskeletal: Normal range of motion. She exhibits no edema and no tenderness.  Neurological: She is alert and oriented to person, place, and time. She has normal reflexes. She displays normal reflexes. No cranial nerve deficit. She exhibits normal muscle tone. Coordination normal.  Skin: Skin is warm and dry. No rash noted. No erythema. No pallor.  Psychiatric: She has a normal mood and affect. Her behavior is normal. Judgment and thought content normal.       Assessment:    Healthy female exam.    Plan:    Mammogram ordered. Follow up in: 1 year.    Meds ordered this encounter  Medications  . mirabegron ER (MYRBETRIQ) 50 MG TB24 tablet    Sig: Take 1 tablet (50 mg total) by mouth daily.    Dispense:  30 tablet    Refill:  11    No orders of the defined types were placed in this encounter.

## 2015-06-16 ENCOUNTER — Telehealth: Payer: Self-pay | Admitting: Obstetrics & Gynecology

## 2015-06-16 NOTE — Telephone Encounter (Signed)
Pt informed PA submitted for Myrbetriq. Will contact pt with results.

## 2015-06-22 ENCOUNTER — Telehealth: Payer: Self-pay | Admitting: Obstetrics & Gynecology

## 2015-06-22 MED ORDER — SOLIFENACIN SUCCINATE 10 MG PO TABS: 10.0000 mg | ORAL_TABLET | Freq: Every day | ORAL | Status: DC

## 2015-06-22 NOTE — Telephone Encounter (Signed)
Pt insurance will not cover Myrbetriq. Is there an alternative?

## 2015-07-20 DIAGNOSIS — G35 Multiple sclerosis: Secondary | ICD-10-CM | POA: Diagnosis not present

## 2015-07-20 DIAGNOSIS — Z79899 Other long term (current) drug therapy: Secondary | ICD-10-CM | POA: Diagnosis not present

## 2015-07-20 DIAGNOSIS — G43009 Migraine without aura, not intractable, without status migrainosus: Secondary | ICD-10-CM | POA: Diagnosis not present

## 2015-07-20 DIAGNOSIS — D899 Disorder involving the immune mechanism, unspecified: Secondary | ICD-10-CM | POA: Diagnosis not present

## 2015-07-31 DIAGNOSIS — G35 Multiple sclerosis: Secondary | ICD-10-CM | POA: Diagnosis not present

## 2015-08-05 DIAGNOSIS — G600 Hereditary motor and sensory neuropathy: Secondary | ICD-10-CM | POA: Diagnosis not present

## 2015-08-19 DIAGNOSIS — G6289 Other specified polyneuropathies: Secondary | ICD-10-CM | POA: Diagnosis not present

## 2015-09-09 DIAGNOSIS — Z1389 Encounter for screening for other disorder: Secondary | ICD-10-CM | POA: Diagnosis not present

## 2015-09-09 DIAGNOSIS — E063 Autoimmune thyroiditis: Secondary | ICD-10-CM | POA: Diagnosis not present

## 2015-09-09 DIAGNOSIS — K5190 Ulcerative colitis, unspecified, without complications: Secondary | ICD-10-CM | POA: Diagnosis not present

## 2015-09-09 DIAGNOSIS — G35 Multiple sclerosis: Secondary | ICD-10-CM | POA: Diagnosis not present

## 2015-09-09 DIAGNOSIS — Z6821 Body mass index (BMI) 21.0-21.9, adult: Secondary | ICD-10-CM | POA: Diagnosis not present

## 2015-09-09 DIAGNOSIS — R202 Paresthesia of skin: Secondary | ICD-10-CM | POA: Diagnosis not present

## 2016-04-01 DIAGNOSIS — Z23 Encounter for immunization: Secondary | ICD-10-CM | POA: Diagnosis not present

## 2016-04-28 ENCOUNTER — Other Ambulatory Visit: Payer: Self-pay | Admitting: *Deleted

## 2016-04-28 ENCOUNTER — Telehealth: Payer: Self-pay | Admitting: Obstetrics & Gynecology

## 2016-04-28 MED ORDER — LEVOTHYROXINE SODIUM 50 MCG PO TABS: 50.0000 ug | ORAL_TABLET | Freq: Every day | ORAL | 11 refills | Status: DC

## 2016-04-28 NOTE — Telephone Encounter (Signed)
Informed patient prescription was sent to pharmacy. 

## 2016-06-16 ENCOUNTER — Ambulatory Visit (INDEPENDENT_AMBULATORY_CARE_PROVIDER_SITE_OTHER): Payer: BLUE CROSS/BLUE SHIELD | Admitting: Obstetrics & Gynecology

## 2016-06-16 ENCOUNTER — Encounter: Payer: Self-pay | Admitting: Obstetrics & Gynecology

## 2016-06-16 VITALS — BP 90/60 | HR 72 | Ht 64.0 in | Wt 132.0 lb

## 2016-06-16 DIAGNOSIS — Z1211 Encounter for screening for malignant neoplasm of colon: Secondary | ICD-10-CM

## 2016-06-16 DIAGNOSIS — Z01419 Encounter for gynecological examination (general) (routine) without abnormal findings: Secondary | ICD-10-CM

## 2016-06-16 DIAGNOSIS — Z1212 Encounter for screening for malignant neoplasm of rectum: Secondary | ICD-10-CM

## 2016-06-16 MED ORDER — ESTRADIOL 2 MG PO TABS: 1.0000 mg | ORAL_TABLET | Freq: Every day | ORAL | 11 refills | Status: DC

## 2016-06-16 NOTE — Progress Notes (Signed)
Subjective:     Sandra Carey is a 51 y.o. female here for a routine exam.  No LMP recorded. Patient has had a hysterectomy. No obstetric history on file. Birth Control Method:  hysterectomy Menstrual Calendar(currently): amenorrheic  Current complaints: constipation.   Current acute medical issues:  constipation   Recent Gynecologic History No LMP recorded. Patient has had a hysterectomy. Last Pap: ,   Last mammogram: 12/2014,  normal  Past Medical History:  Diagnosis Date  . Depression   . Generalized headaches   . HA (headache)   . Hypothyroid   . MS (multiple sclerosis) (HCC)     Past Surgical History:  Procedure Laterality Date  . bunions surgery     Dr. Pricilla Holmucker 8-9 yrs ago  . CHOLECYSTECTOMY N/A 10/15/2012   Procedure: LAPAROSCOPIC CHOLECYSTECTOMY;  Surgeon: Dalia HeadingMark A Jenkins, MD;  Location: [facility];  Service: General;  Laterality: N/A;  . VAGINAL HYSTERECTOMY     ovaries remained    OB History    No data available      Social History   Social History  . Marital status: Married    Spouse name: Cindee Lameete  . Number of children: 2  . Years of education: college   Occupational History  .  [employer]    [employer]   Social History Main Topics  . Smoking status: Former Games developermoker  . Smokeless tobacco: Never Used  . Alcohol use 0.0 oz/week     Comment: rarely  . Drug use: No  . Sexual activity: Yes    Birth control/ protection: Surgical   Other Topics Concern  . None   Social History Narrative   Patient lives at home with her husband [name]([place])    Education- College   Caffeine- three times a week.   Right handed.    History reviewed. No pertinent family history.   Current Outpatient Prescriptions:  .  ibuprofen (ADVIL,MOTRIN) 800 MG tablet, Take 1 tablet (800 mg total) by mouth every 8 (eight) hours as needed., Disp: 30 tablet, Rfl: 6 .  levothyroxine (SYNTHROID) 50 MCG tablet, Take 1 tablet (50 mcg total) by mouth daily before breakfast., Disp: 30  tablet, Rfl: 11 .  estradiol (ESTRACE) 2 MG tablet, Take 0.5 tablets (1 mg total) by mouth daily., Disp: 30 tablet, Rfl: 11  Review of Systems  Review of Systems  Constitutional: Negative for fever, chills, weight loss, malaise/fatigue and diaphoresis.  HENT: Negative for hearing loss, ear pain, nosebleeds, congestion, sore throat, neck pain, tinnitus and ear discharge.   Eyes: Negative for blurred vision, double vision, photophobia, pain, discharge and redness.  Respiratory: Negative for cough, hemoptysis, sputum production, shortness of breath, wheezing and stridor.   Cardiovascular: Negative for chest pain, palpitations, orthopnea, claudication, leg swelling and PND.  Gastrointestinal: negative for abdominal pain. Negative for heartburn, nausea, vomiting, diarrhea, constipation, blood in stool and melena.  Genitourinary: Negative for dysuria, urgency, frequency, hematuria and flank pain.  Musculoskeletal: Negative for myalgias, back pain, joint pain and falls.  Skin: Negative for itching and rash.  Neurological: Negative for dizziness, tingling, tremors, sensory change, speech change, focal weakness, seizures, loss of consciousness, weakness and headaches.  Endo/Heme/Allergies: Negative for environmental allergies and polydipsia. Does not bruise/bleed easily.  Psychiatric/Behavioral: Negative for depression, suicidal ideas, hallucinations, memory loss and substance abuse. The patient is not nervous/anxious and does not have insomnia.        Objective:  Blood pressure 90/60, pulse 72, height 5\' 4"  (1.626 m), weight 132  lb (59.9 kg).   Physical Exam  Vitals reviewed. Constitutional: She is oriented to person, place, and time. She appears well-developed and well-nourished.  HENT:  Head: Normocephalic and atraumatic.        Right Ear: External ear normal.  Left Ear: External ear normal.  Nose: Nose normal.  Mouth/Throat: Oropharynx is clear and moist.  Eyes: Conjunctivae and EOM are  normal. Pupils are equal, round, and reactive to light. Right eye exhibits no discharge. Left eye exhibits no discharge. No scleral icterus.  Neck: Normal range of motion. Neck supple. No tracheal deviation present. No thyromegaly present.  Cardiovascular: Normal rate, regular rhythm, normal heart sounds and intact distal pulses.  Exam reveals no gallop and no friction rub.   No murmur heard. Respiratory: Effort normal and breath sounds normal. No respiratory distress. She has no wheezes. She has no rales. She exhibits no tenderness.  GI: Soft. Bowel sounds are normal. She exhibits no distension and no mass. There is no tenderness. There is no rebound and no guarding.  Genitourinary:  Breasts no masses skin changes or nipple changes bilaterally      Vulva is normal without lesions Vagina is pink moist without discharge Cervix absent Uterus is absent Adnexa is negative  {Rectal    hemoccult negative, normal tone, no masses  Musculoskeletal: Normal range of motion. She exhibits no edema and no tenderness.  Neurological: She is alert and oriented to person, place, and time. She has normal reflexes. She displays normal reflexes. No cranial nerve deficit. She exhibits normal muscle tone. Coordination normal.  Skin: Skin is warm and dry. No rash noted. No erythema. No pallor.  Psychiatric: She has a normal mood and affect. Her behavior is normal. Judgment and thought content normal.       Medications Ordered at today's visit: Meds ordered this encounter  Medications  . estradiol (ESTRACE) 2 MG tablet    Sig: Take 0.5 tablets (1 mg total) by mouth daily.    Dispense:  30 tablet    Refill:  11    Other orders placed at today's visit: No orders of the defined types were placed in this encounter.     Assessment:    Healthy female exam.   constipation Plan:    Mammogram ordered. Follow up in: 1 year. recommend kefir, souerkrutt and fermented foods in general for helping to solve her  constipation problem    Pt will contact me and let me know how it is going   Return in about 1 year (around 06/16/2017) for yearly, with Dr Despina Hidden.

## 2016-08-01 ENCOUNTER — Other Ambulatory Visit: Payer: Self-pay | Admitting: Adult Health

## 2016-08-03 ENCOUNTER — Other Ambulatory Visit: Payer: Self-pay | Admitting: Adult Health

## 2016-08-17 DIAGNOSIS — L255 Unspecified contact dermatitis due to plants, except food: Secondary | ICD-10-CM | POA: Diagnosis not present

## 2016-08-17 DIAGNOSIS — Z1389 Encounter for screening for other disorder: Secondary | ICD-10-CM | POA: Diagnosis not present

## 2016-08-17 DIAGNOSIS — Z6820 Body mass index (BMI) 20.0-20.9, adult: Secondary | ICD-10-CM | POA: Diagnosis not present

## 2016-08-21 DIAGNOSIS — Z1211 Encounter for screening for malignant neoplasm of colon: Secondary | ICD-10-CM | POA: Diagnosis not present

## 2016-09-03 ENCOUNTER — Other Ambulatory Visit: Payer: Self-pay | Admitting: Obstetrics & Gynecology

## 2016-11-22 DIAGNOSIS — Z113 Encounter for screening for infections with a predominantly sexual mode of transmission: Secondary | ICD-10-CM | POA: Diagnosis not present

## 2016-11-22 DIAGNOSIS — Z114 Encounter for screening for human immunodeficiency virus [HIV]: Secondary | ICD-10-CM | POA: Diagnosis not present

## 2017-02-07 DIAGNOSIS — E039 Hypothyroidism, unspecified: Secondary | ICD-10-CM | POA: Diagnosis not present

## 2017-02-07 DIAGNOSIS — Z1389 Encounter for screening for other disorder: Secondary | ICD-10-CM | POA: Diagnosis not present

## 2017-02-07 DIAGNOSIS — K219 Gastro-esophageal reflux disease without esophagitis: Secondary | ICD-10-CM | POA: Diagnosis not present

## 2017-02-07 DIAGNOSIS — I959 Hypotension, unspecified: Secondary | ICD-10-CM | POA: Diagnosis not present

## 2017-02-07 DIAGNOSIS — Z681 Body mass index (BMI) 19 or less, adult: Secondary | ICD-10-CM | POA: Diagnosis not present

## 2017-02-07 DIAGNOSIS — Z23 Encounter for immunization: Secondary | ICD-10-CM | POA: Diagnosis not present

## 2017-02-09 DIAGNOSIS — Z1211 Encounter for screening for malignant neoplasm of colon: Secondary | ICD-10-CM | POA: Diagnosis not present

## 2017-04-13 DIAGNOSIS — N3000 Acute cystitis without hematuria: Secondary | ICD-10-CM | POA: Diagnosis not present

## 2017-04-17 ENCOUNTER — Other Ambulatory Visit: Payer: Self-pay | Admitting: Obstetrics & Gynecology

## 2017-05-30 ENCOUNTER — Other Ambulatory Visit: Payer: Self-pay | Admitting: Obstetrics & Gynecology

## 2017-05-30 DIAGNOSIS — Z1231 Encounter for screening mammogram for malignant neoplasm of breast: Secondary | ICD-10-CM

## 2017-05-31 ENCOUNTER — Ambulatory Visit (HOSPITAL_COMMUNITY)
Admission: RE | Admit: 2017-05-31 | Discharge: 2017-05-31 | Disposition: A | Discharge status: Home / Self Care | Payer: BLUE CROSS/BLUE SHIELD | Source: Ambulatory Visit | Attending: Obstetrics & Gynecology | Admitting: Obstetrics & Gynecology

## 2017-05-31 ENCOUNTER — Encounter (HOSPITAL_COMMUNITY): Payer: Self-pay

## 2017-05-31 DIAGNOSIS — Z1231 Encounter for screening mammogram for malignant neoplasm of breast: Secondary | ICD-10-CM

## 2017-06-19 ENCOUNTER — Encounter: Payer: Self-pay | Admitting: Obstetrics & Gynecology

## 2017-06-19 ENCOUNTER — Ambulatory Visit (INDEPENDENT_AMBULATORY_CARE_PROVIDER_SITE_OTHER): Payer: BLUE CROSS/BLUE SHIELD | Admitting: Obstetrics & Gynecology

## 2017-06-19 VITALS — BP 100/60 | HR 99 | Ht 64.0 in | Wt 125.4 lb

## 2017-06-19 DIAGNOSIS — Z01411 Encounter for gynecological examination (general) (routine) with abnormal findings: Secondary | ICD-10-CM | POA: Diagnosis not present

## 2017-06-19 DIAGNOSIS — K5904 Chronic idiopathic constipation: Secondary | ICD-10-CM

## 2017-06-19 DIAGNOSIS — Z01419 Encounter for gynecological examination (general) (routine) without abnormal findings: Secondary | ICD-10-CM

## 2017-06-19 DIAGNOSIS — N393 Stress incontinence (female) (male): Secondary | ICD-10-CM

## 2017-06-19 DIAGNOSIS — N952 Postmenopausal atrophic vaginitis: Secondary | ICD-10-CM

## 2017-06-19 DIAGNOSIS — Z1211 Encounter for screening for malignant neoplasm of colon: Secondary | ICD-10-CM

## 2017-06-19 DIAGNOSIS — N3946 Mixed incontinence: Secondary | ICD-10-CM

## 2017-06-19 DIAGNOSIS — Z1212 Encounter for screening for malignant neoplasm of rectum: Secondary | ICD-10-CM

## 2017-06-19 LAB — HEMOCCULT GUIAC POC 1CARD (OFFICE): FECAL OCCULT BLD: NEGATIVE

## 2017-06-19 MED ORDER — ESTRADIOL 1 MG/GM TD GEL: 1.0000 | Freq: Every day | TRANSDERMAL | 11 refills | Status: DC

## 2017-06-19 MED ORDER — LINACLOTIDE 72 MCG PO CAPS: 72.0000 ug | ORAL_CAPSULE | Freq: Every day | ORAL | 11 refills | Status: DC

## 2017-06-19 NOTE — Progress Notes (Signed)
Subjective:     Sandra Carey is a 52 y.o. female here for a routine exam.  No LMP recorded. Patient has had a hysterectomy. G2P2 Birth Control Method:  hysterectomy Menstrual Calendar(currently): amnorrheic  Current complaints: SUI, constipation  Current acute medical issues:  none   Recent Gynecologic History No LMP recorded. Patient has had a hysterectomy. Last Pap: year ago,  normal Last mammogram: 05/31/2017,  normal  Past Medical History:  Diagnosis Date  . Depression   . Generalized headaches   . HA (headache)   . Hypothyroid   . MS (multiple sclerosis) (HCC)     Past Surgical History:  Procedure Laterality Date  . AUGMENTATION MAMMAPLASTY Bilateral   . bunions surgery     Dr. Pricilla Holm 8-9 yrs ago  . CHOLECYSTECTOMY N/A 10/15/2012   Procedure: LAPAROSCOPIC CHOLECYSTECTOMY;  Surgeon: Dalia Heading, MD;  Location: [facility];  Service: General;  Laterality: N/A;  . VAGINAL HYSTERECTOMY     ovaries remained    OB History    Gravida  2   Para  2   Term      Preterm      AB      Living        SAB      TAB      Ectopic      Multiple      Live Births              Social History   Socioeconomic History  . Marital status: Married    Spouse name: Cindee Lame  . Number of children: 2  . Years of education: college  . Highest education level: Not on file  Occupational History    Employer: [employer]    Comment: [employer]  Social Needs  . Financial resource strain: Not on file  . Food insecurity:    Worry: Not on file    Inability: Not on file  . Transportation needs:    Medical: Not on file    Non-medical: Not on file  Tobacco Use  . Smoking status: Former Games developer  . Smokeless tobacco: Never Used  Substance and Sexual Activity  . Alcohol use: Yes    Alcohol/week: 0.0 oz    Comment: rarely  . Drug use: No  . Sexual activity: Yes    Birth control/protection: Surgical  Lifestyle  . Physical activity:    Days per week: Not on file   Minutes per session: Not on file  . Stress: Not on file  Relationships  . Social connections:    Talks on phone: Not on file    Gets together: Not on file    Attends religious service: Not on file    Active member of club or organization: Not on file    Attends meetings of clubs or organizations: Not on file    Relationship status: Not on file  Other Topics Concern  . Not on file  Social History Narrative   Patient lives at home with her husband Sandra Carey)    Education- College   Caffeine- three times a week.   Right handed.    History reviewed. No pertinent family history.   Current Outpatient Medications:  .  levothyroxine (SYNTHROID, LEVOTHROID) 50 MCG tablet, TAKE ONE TABLET BY MOUTH ONCE DAILY BEFORE BREAKFAST, Disp: 30 tablet, Rfl: 11 .  topiramate (TOPAMAX) 50 MG tablet, TAKE ONE TABLET BY MOUTH ONCE DAILY, Disp: 30 tablet, Rfl: 11 .  Estradiol (DIVIGEL) 1 MG/GM GEL, Place  1 Package onto the skin daily., Disp: 30 g, Rfl: 11 .  estradiol (ESTRACE) 2 MG tablet, Take 0.5 tablets (1 mg total) by mouth daily. (Patient not taking: Reported on 06/19/2017), Disp: 30 tablet, Rfl: 11 .  ibuprofen (ADVIL,MOTRIN) 800 MG tablet, TAKE ONE TABLET BY MOUTH EVERY 8 HOURS AS NEEDED (Patient not taking: Reported on 06/19/2017), Disp: 30 tablet, Rfl: 6 .  linaclotide (LINZESS) 72 MCG capsule, Take 1 capsule (72 mcg total) by mouth daily before breakfast., Disp: 30 capsule, Rfl: 11  Review of Systems  Review of Systems  Constitutional: Negative for fever, chills, weight loss, malaise/fatigue and diaphoresis.  HENT: Negative for hearing loss, ear pain, nosebleeds, congestion, sore throat, neck pain, tinnitus and ear discharge.   Eyes: Negative for blurred vision, double vision, photophobia, pain, discharge and redness.  Respiratory: Negative for cough, hemoptysis, sputum production, shortness of breath, wheezing and stridor.   Cardiovascular: Negative for chest pain, palpitations, orthopnea,  claudication, leg swelling and PND.  Gastrointestinal: negative for abdominal pain. Negative for heartburn, nausea, vomiting, diarrhea, constipation, blood in stool and melena.  Genitourinary: Negative for dysuria, urgency, frequency, hematuria and flank pain.  Musculoskeletal: Negative for myalgias, back pain, joint pain and falls.  Skin: Negative for itching and rash.  Neurological: Negative for dizziness, tingling, tremors, sensory change, speech change, focal weakness, seizures, loss of consciousness, weakness and headaches.  Endo/Heme/Allergies: Negative for environmental allergies and polydipsia. Does not bruise/bleed easily.  Psychiatric/Behavioral: Negative for depression, suicidal ideas, hallucinations, memory loss and substance abuse. The patient is not nervous/anxious and does not have insomnia.        Objective:  Blood pressure 100/60, pulse 99, height 5\' 4"  (1.626 m), weight 125 lb 6.4 oz (56.9 kg).   Physical Exam  Vitals reviewed. Constitutional: She is oriented to person, place, and time. She appears well-developed and well-nourished.  HENT:  Head: Normocephalic and atraumatic.        Right Ear: External ear normal.  Left Ear: External ear normal.  Nose: Nose normal.  Mouth/Throat: Oropharynx is clear and moist.  Eyes: Conjunctivae and EOM are normal. Pupils are equal, round, and reactive to light. Right eye exhibits no discharge. Left eye exhibits no discharge. No scleral icterus.  Neck: Normal range of motion. Neck supple. No tracheal deviation present. No thyromegaly present.  Cardiovascular: Normal rate, regular rhythm, normal heart sounds and intact distal pulses.  Exam reveals no gallop and no friction rub.   No murmur heard. Respiratory: Effort normal and breath sounds normal. No respiratory distress. She has no wheezes. She has no rales. She exhibits no tenderness.  GI: Soft. Bowel sounds are normal. She exhibits no distension and no mass. There is no tenderness.  There is no rebound and no guarding.  Genitourinary:  Breasts bilateral implants, normal otherwise      Vulva is normal without lesions Vagina is atrophic changes Cervix absent Uterus is absent Adnexa is negative  {Rectal    hemoccult negative, normal tone, no masses  Musculoskeletal: Normal range of motion. She exhibits no edema and no tenderness.  Neurological: She is alert and oriented to person, place, and time. She has normal reflexes. She displays normal reflexes. No cranial nerve deficit. She exhibits normal muscle tone. Coordination normal.  Skin: Skin is warm and dry. No rash noted. No erythema. No pallor.  Psychiatric: She has a normal mood and affect. Her behavior is normal. Judgment and thought content normal.       Medications Ordered at today's visit:  Meds ordered this encounter  Medications  . Estradiol (DIVIGEL) 1 MG/GM GEL    Sig: Place 1 Package onto the skin daily.    Dispense:  30 g    Refill:  11  . linaclotide (LINZESS) 72 MCG capsule    Sig: Take 1 capsule (72 mcg total) by mouth daily before breakfast.    Dispense:  30 capsule    Refill:  11    Other orders placed at today's visit: No orders of the defined types were placed in this encounter.     Assessment:    Healthy female exam.  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 Plan:    Patient is given a prescription for Divigel 1 mg daily as well as some initial samples of some Premarin vaginal cream see if we can improve her vaginal health which should help both dyspareunia as well as stress urinary incontinence  Patient is aware that more than likely the solution to her incontinence since it seems to be purely stress related is going to be a sling however at her age I would definitely try to postpone that as long as I possibly could As a result we will try vaginal estrogen  systemic estrogen as well as continuing to use the poise  I am also starting her on Linzess 72 mg and see if that will be effective with her chronic idiopathic constipation She has been using MiraLAX powder without success I can increase her to 145 mg if needed     Return in about 6 weeks (around 07/31/2017) for Follow up, with Dr Despina HiddenEure.

## 2017-06-19 NOTE — Addendum Note (Signed)
Addended by: Colen DarlingYOUNG, Dimitriy Carreras S on: 06/19/2017 12:05 PM   Modules accepted: Orders

## 2017-06-20 ENCOUNTER — Other Ambulatory Visit: Payer: Self-pay | Admitting: Obstetrics & Gynecology

## 2017-06-20 MED ORDER — ESTRADIOL 2 MG PO TABS: 1.0000 mg | ORAL_TABLET | Freq: Every day | ORAL | 11 refills | Status: DC

## 2017-07-02 ENCOUNTER — Encounter: Payer: Self-pay | Admitting: Obstetrics & Gynecology

## 2017-07-31 ENCOUNTER — Ambulatory Visit (INDEPENDENT_AMBULATORY_CARE_PROVIDER_SITE_OTHER): Payer: BLUE CROSS/BLUE SHIELD | Admitting: Obstetrics & Gynecology

## 2017-07-31 ENCOUNTER — Encounter: Payer: Self-pay | Admitting: Obstetrics & Gynecology

## 2017-07-31 VITALS — BP 118/74 | Ht 64.0 in | Wt 126.0 lb

## 2017-07-31 DIAGNOSIS — K5904 Chronic idiopathic constipation: Secondary | ICD-10-CM | POA: Diagnosis not present

## 2017-07-31 DIAGNOSIS — N952 Postmenopausal atrophic vaginitis: Secondary | ICD-10-CM | POA: Diagnosis not present

## 2017-07-31 DIAGNOSIS — N393 Stress incontinence (female) (male): Secondary | ICD-10-CM | POA: Diagnosis not present

## 2017-07-31 MED ORDER — ESTROGENS, CONJUGATED 0.625 MG/GM VA CREA: TOPICAL_CREAM | VAGINAL | 12 refills | Status: DC

## 2017-07-31 NOTE — Progress Notes (Signed)
Chief Complaint  Patient presents with  . Follow-up      52 y.o. G2P2 No LMP recorded. Patient has had a hysterectomy. The current method of family planning is status post hysterectomy.  Outpatient Encounter Medications as of 07/31/2017  Medication Sig  . estradiol (ESTRACE) 2 MG tablet Take 0.5 tablets (1 mg total) by mouth daily.  Marland Kitchen levothyroxine (SYNTHROID, LEVOTHROID) 50 MCG tablet TAKE ONE TABLET BY MOUTH ONCE DAILY BEFORE BREAKFAST  . topiramate (TOPAMAX) 50 MG tablet TAKE ONE TABLET BY MOUTH ONCE DAILY  . conjugated estrogens (PREMARIN) vaginal cream Use 1 gram nightly  . Estradiol (DIVIGEL) 1 MG/GM GEL Place 1 Package onto the skin daily. (Patient not taking: Reported on 07/31/2017)  . ibuprofen (ADVIL,MOTRIN) 800 MG tablet TAKE ONE TABLET BY MOUTH EVERY 8 HOURS AS NEEDED (Patient not taking: Reported on 06/19/2017)  . linaclotide (LINZESS) 72 MCG capsule Take 1 capsule (72 mcg total) by mouth daily before breakfast. (Patient not taking: Reported on 07/31/2017)   No facility-administered encounter medications on file as of 07/31/2017.     Subjective Archie Patten is in for follow-up She had stopped her estrogen and was becoming increasingly symptomatic with vaginal atrophy symptoms as well as increasing problems with stress urinary incontinence She does not have a great deal of bladder neck relaxation so I am curious that this may have been more intrinsic sphincter deficiency which should respond to estrogen She states that is improved dramatically When I saw her for yearly exam restarted her vaginal estrogen overall she states her constipation and her incontinence are improved and her vaginal symptoms with intercourse are still not Where she would like them to be Past Medical History:  Diagnosis Date  . Depression   . Generalized headaches   . HA (headache)   . Hypothyroid   . MS (multiple sclerosis) (HCC)     Past Surgical History:  Procedure Laterality Date  . AUGMENTATION  MAMMAPLASTY Bilateral   . bunions surgery     Dr. Pricilla Holm 8-9 yrs ago  . CHOLECYSTECTOMY N/A 10/15/2012   Procedure: LAPAROSCOPIC CHOLECYSTECTOMY;  Surgeon: Dalia Heading, MD;  Location: [facility];  Service: General;  Laterality: N/A;  . VAGINAL HYSTERECTOMY     ovaries remained    OB History    Gravida  2   Para  2   Term      Preterm      AB      Living        SAB      TAB      Ectopic      Multiple      Live Births              Allergies  Allergen Reactions  . Sulfa Antibiotics Other (See Comments)    Dizziness. Causes patient to pass out.     Social History   Socioeconomic History  . Marital status: Married    Spouse name: Cindee Lame  . Number of children: 2  . Years of education: college  . Highest education level: Not on file  Occupational History    Employer: [employer]    Comment: [employer]  Social Needs  . Financial resource strain: Not on file  . Food insecurity:    Worry: Not on file    Inability: Not on file  . Transportation needs:    Medical: Not on file    Non-medical: Not on file  Tobacco Use  . Smoking  status: Former Games developer  . Smokeless tobacco: Never Used  Substance and Sexual Activity  . Alcohol use: Yes    Alcohol/week: 0.0 oz    Comment: rarely  . Drug use: No  . Sexual activity: Yes    Birth control/protection: Surgical  Lifestyle  . Physical activity:    Days per week: Not on file    Minutes per session: Not on file  . Stress: Not on file  Relationships  . Social connections:    Talks on phone: Not on file    Gets together: Not on file    Attends religious service: Not on file    Active member of club or organization: Not on file    Attends meetings of clubs or organizations: Not on file    Relationship status: Not on file  Other Topics Concern  . Not on file  Social History Narrative   Patient lives at home with her husband [name])    Education- College   Caffeine- three times a week.   Right handed.      History reviewed. No pertinent family history.  Medications:       Current Outpatient Medications:  .  estradiol (ESTRACE) 2 MG tablet, Take 0.5 tablets (1 mg total) by mouth daily., Disp: 30 tablet, Rfl: 11 .  levothyroxine (SYNTHROID, LEVOTHROID) 50 MCG tablet, TAKE ONE TABLET BY MOUTH ONCE DAILY BEFORE BREAKFAST, Disp: 30 tablet, Rfl: 11 .  topiramate (TOPAMAX) 50 MG tablet, TAKE ONE TABLET BY MOUTH ONCE DAILY, Disp: 30 tablet, Rfl: 11 .  conjugated estrogens (PREMARIN) vaginal cream, Use 1 gram nightly, Disp: 30 g, Rfl: 12 .  Estradiol (DIVIGEL) 1 MG/GM GEL, Place 1 Package onto the skin daily. (Patient not taking: Reported on 07/31/2017), Disp: 30 g, Rfl: 11 .  ibuprofen (ADVIL,MOTRIN) 800 MG tablet, TAKE ONE TABLET BY MOUTH EVERY 8 HOURS AS NEEDED (Patient not taking: Reported on 06/19/2017), Disp: 30 tablet, Rfl: 6 .  linaclotide (LINZESS) 72 MCG capsule, Take 1 capsule (72 mcg total) by mouth daily before breakfast. (Patient not taking: Reported on 07/31/2017), Disp: 30 capsule, Rfl: 11  Objective Blood pressure 118/74, height  (1.626 m), weight 126 lb (57.2 kg).  In general well-developed well-nourished female no acute distress She has normal external genitalia normal vulva no masses or lesions Her vagina is somewhat atrophic but improved from her last visit Cuff is intact with no masses  Pertinent ROS No burning with urination, frequency or urgency No nausea, vomiting or diarrhea Nor fever chills or other constitutional symptoms   Labs or studies     Impression Diagnoses this Encounter::   ICD-10-CM   1. Atrophic vaginitis N95.2   2. SUI (stress urinary incontinence, female) N39.3   3. Chronic idiopathic constipation K59.04     Established relevant diagnosis(es):   Plan/Recommendations: Meds ordered this encounter  Medications  . conjugated estrogens (PREMARIN) vaginal cream    Sig: Use 1 gram nightly    Dispense:  30 g    Refill:  12    Labs or  Scans Ordered: No orders of the defined types were placed in this encounter.   Management:: I have e prescribed premarin vaginal cream to use nightly and she will continue the 1 mg oral estradiol daily Her incontinence is improved on the estrogen Also her constipation is improved, the linzess was too expensive  Follow up Return in about 1 year (around 08/01/2018) for yearly, with Dr Despina Hidden.  All questions were answered.

## 2017-08-01 ENCOUNTER — Encounter: Payer: Self-pay | Admitting: Obstetrics & Gynecology

## 2017-08-22 ENCOUNTER — Other Ambulatory Visit: Payer: Self-pay | Admitting: Adult Health

## 2017-09-27 ENCOUNTER — Other Ambulatory Visit: Payer: Self-pay | Admitting: Obstetrics & Gynecology

## 2018-02-21 DIAGNOSIS — M7631 Iliotibial band syndrome, right leg: Secondary | ICD-10-CM | POA: Diagnosis not present

## 2018-02-21 DIAGNOSIS — Z0000 Encounter for general adult medical examination without abnormal findings: Secondary | ICD-10-CM | POA: Diagnosis not present

## 2018-02-21 DIAGNOSIS — Z1389 Encounter for screening for other disorder: Secondary | ICD-10-CM | POA: Diagnosis not present

## 2018-02-21 DIAGNOSIS — Z6821 Body mass index (BMI) 21.0-21.9, adult: Secondary | ICD-10-CM | POA: Diagnosis not present

## 2018-03-12 ENCOUNTER — Encounter (INDEPENDENT_AMBULATORY_CARE_PROVIDER_SITE_OTHER): Payer: Self-pay | Admitting: *Deleted

## 2018-04-04 ENCOUNTER — Other Ambulatory Visit (INDEPENDENT_AMBULATORY_CARE_PROVIDER_SITE_OTHER): Payer: Self-pay | Admitting: *Deleted

## 2018-04-04 DIAGNOSIS — Z1211 Encounter for screening for malignant neoplasm of colon: Secondary | ICD-10-CM | POA: Insufficient documentation

## 2018-04-24 ENCOUNTER — Other Ambulatory Visit: Payer: Self-pay | Admitting: Obstetrics & Gynecology

## 2018-05-24 ENCOUNTER — Ambulatory Visit (INDEPENDENT_AMBULATORY_CARE_PROVIDER_SITE_OTHER): Payer: BLUE CROSS/BLUE SHIELD | Admitting: Internal Medicine

## 2018-05-24 ENCOUNTER — Encounter (INDEPENDENT_AMBULATORY_CARE_PROVIDER_SITE_OTHER): Payer: Self-pay | Admitting: Internal Medicine

## 2018-05-24 VITALS — BP 109/71 | HR 75 | Temp 98.1°F | Ht 65.0 in | Wt 135.0 lb

## 2018-05-24 DIAGNOSIS — K5909 Other constipation: Secondary | ICD-10-CM | POA: Diagnosis not present

## 2018-05-24 NOTE — Patient Instructions (Signed)
Samples of Amitiza given to patient.  

## 2018-05-24 NOTE — Progress Notes (Signed)
   Subjective:    Patient ID: Sandra Carey, female    DOB: [birth year], 53 y.o.   MRN: [id number]  HPI Here today with c/o constipation. Is scheduled for a colonoscopy 07/18/2018. She was last seen in our office in August of 2016 for constipation. Has tried Miralax which did not help. Amitiza samples  were given to her at OV. She did not let me know how this worked. She has a BM 2-3 times a week. Stools are like little pellets. Her appetite is good.  Review of Systems Past Medical History:  Diagnosis Date  . Depression   . Generalized headaches   . HA (headache)   . Hypothyroid   . MS (multiple sclerosis) (HCC)     Past Surgical History:  Procedure Laterality Date  . AUGMENTATION MAMMAPLASTY Bilateral   . bunions surgery     Dr. Pricilla Holm 8-9 yrs ago  . CHOLECYSTECTOMY N/A 10/15/2012   Procedure: LAPAROSCOPIC CHOLECYSTECTOMY;  Surgeon: Dalia Heading, MD;  Location: [facility];  Service: General;  Laterality: N/A;  . VAGINAL HYSTERECTOMY     ovaries remained    Allergies  Allergen Reactions  . Sulfa Antibiotics Other (See Comments)    Dizziness. Causes patient to pass out.     Current Outpatient Medications on File Prior to Visit  Medication Sig Dispense Refill  . estradiol (ESTRACE) 2 MG tablet Take 0.5 tablets (1 mg total) by mouth daily. 30 tablet 11  . ibuprofen (ADVIL,MOTRIN) 800 MG tablet TAKE 1 TABLET BY MOUTH EVERY 8 HOURS AS NEEDED 60 tablet 6  . topiramate (TOPAMAX) 50 MG tablet TAKE 1 TABLET BY MOUTH ONCE DAILY 90 tablet 3   No current facility-administered medications on file prior to visit.         Objective:   Physical Exam Blood pressure 109/71, pulse 75, temperature 98.1 F (36.7 C), height 5\' 5"  (1.651 m), weight 135 lb (61.2 kg). Alert and oriented. Skin warm and dry. Oral mucosa is moist.   . Sclera anicteric, conjunctivae is pink. Thyroid not enlarged. No cervical lymphadenopathy. Lungs clear. Heart regular rate and rhythm.  Abdomen is soft. Bowel sounds  are positive. No hepatomegaly. No abdominal masses felt. No tenderness.  No edema to lower extremities.         Assessment & Plan:  Constipation. Am going to start her own Amitiza24 mcg BID again and see how she does (samples x 3 boxes). If she does well, will send an Rx in for this.

## 2018-05-25 ENCOUNTER — Encounter (INDEPENDENT_AMBULATORY_CARE_PROVIDER_SITE_OTHER): Payer: Self-pay

## 2018-05-29 ENCOUNTER — Telehealth (INDEPENDENT_AMBULATORY_CARE_PROVIDER_SITE_OTHER): Payer: Self-pay | Admitting: *Deleted

## 2018-05-29 ENCOUNTER — Encounter (INDEPENDENT_AMBULATORY_CARE_PROVIDER_SITE_OTHER): Payer: Self-pay | Admitting: *Deleted

## 2018-05-29 NOTE — Telephone Encounter (Signed)
Patient needs suprep 

## 2018-05-30 MED ORDER — SUPREP BOWEL PREP KIT 17.5-3.13-1.6 GM/177ML PO SOLN: 1.0000 | Freq: Once | ORAL | 0 refills | Status: AC

## 2018-06-21 ENCOUNTER — Telehealth (INDEPENDENT_AMBULATORY_CARE_PROVIDER_SITE_OTHER): Payer: Self-pay | Admitting: *Deleted

## 2018-06-21 NOTE — Telephone Encounter (Signed)
Referring MD/PCP: golding   Procedure: tcs  Reason/Indication:  screening  Has patient had this procedure before?  no  If so, when, by whom and where?    Is there a family history of colon cancer?  no  Who?  What age when diagnosed?    Is patient diabetic?   no      Does patient have prosthetic heart valve or mechanical valve?  no  Do you have a pacemaker?  no  Has patient ever had endocarditis? no  Has patient had joint replacement within last 12 months?  no  Is patient constipated or do they take laxatives? no  Does patient have a history of alcohol/drug use?  no  Is patient on blood thinner such as Coumadin, Plavix and/or Aspirin? no  Medications: topirmate 50 mg daily, levothyroxine 50 mcg daily  Allergies: sulfa  Medication Adjustment per Dr Keane Police, NP:   Procedure date & time: 07/18/18 at 830

## 2018-06-21 NOTE — Telephone Encounter (Signed)
agree

## 2018-08-07 ENCOUNTER — Telehealth (INDEPENDENT_AMBULATORY_CARE_PROVIDER_SITE_OTHER): Payer: Self-pay | Admitting: Internal Medicine

## 2018-08-07 ENCOUNTER — Encounter (INDEPENDENT_AMBULATORY_CARE_PROVIDER_SITE_OTHER): Payer: Self-pay

## 2018-08-07 DIAGNOSIS — K5900 Constipation, unspecified: Secondary | ICD-10-CM

## 2018-08-07 MED ORDER — LUBIPROSTONE 24 MCG PO CAPS: 24.0000 ug | ORAL_CAPSULE | Freq: Two times a day (BID) | ORAL | 3 refills | Status: DC

## 2018-08-07 NOTE — Telephone Encounter (Signed)
Rx for Amitiza sent to her pharmacy 

## 2018-08-09 ENCOUNTER — Other Ambulatory Visit (INDEPENDENT_AMBULATORY_CARE_PROVIDER_SITE_OTHER): Payer: Self-pay | Admitting: *Deleted

## 2018-08-09 ENCOUNTER — Other Ambulatory Visit (HOSPITAL_COMMUNITY): Payer: Self-pay | Admitting: Obstetrics & Gynecology

## 2018-08-09 DIAGNOSIS — K5909 Other constipation: Secondary | ICD-10-CM

## 2018-08-09 DIAGNOSIS — Z1231 Encounter for screening mammogram for malignant neoplasm of breast: Secondary | ICD-10-CM

## 2018-08-09 MED ORDER — PLECANATIDE 3 MG PO TABS: 3.0000 mg | ORAL_TABLET | Freq: Every day | ORAL | 5 refills | Status: DC

## 2018-10-01 ENCOUNTER — Encounter (INDEPENDENT_AMBULATORY_CARE_PROVIDER_SITE_OTHER): Payer: Self-pay | Admitting: *Deleted

## 2018-10-08 ENCOUNTER — Inpatient Hospital Stay (HOSPITAL_COMMUNITY): Admission: RE | Admit: 2018-10-08 | Payer: BLUE CROSS/BLUE SHIELD | Source: Ambulatory Visit

## 2018-10-18 ENCOUNTER — Telehealth (INDEPENDENT_AMBULATORY_CARE_PROVIDER_SITE_OTHER): Payer: Self-pay | Admitting: *Deleted

## 2018-10-18 NOTE — Telephone Encounter (Signed)
Referring MD/PCP: golding   Procedure: tcs  Reason/Indication:  screening  Has patient had this procedure before?  no             If so, when, by whom and where?    Is there a family history of colon cancer?  no             Who?  What age when diagnosed?    Is patient diabetic?   no                                                  Does patient have prosthetic heart valve or mechanical valve?  no  Do you have a pacemaker?  no  Has patient ever had endocarditis? no  Has patient had joint replacement within last 12 months?  no  Is patient constipated or do they take laxatives? no  Does patient have a history of alcohol/drug use?  no  Is patient on blood thinner such as Coumadin, Plavix and/or Aspirin? no  Medications: topirmate 50 mg daily, levothyroxine 50 mcg daily  Allergies: sulfa  Medication Adjustment per Dr Lindi Adie, NP:   Procedure date & time: 11/14/18 at 730

## 2018-10-18 NOTE — Telephone Encounter (Signed)
agree

## 2018-10-22 ENCOUNTER — Ambulatory Visit (HOSPITAL_COMMUNITY): Payer: BC Managed Care – PPO

## 2018-11-14 ENCOUNTER — Ambulatory Visit (HOSPITAL_COMMUNITY)
Admission: RE | Admit: 2018-11-14 | Payer: BC Managed Care – PPO | Source: Home / Self Care | Admitting: Internal Medicine

## 2018-11-14 ENCOUNTER — Encounter (HOSPITAL_COMMUNITY): Admission: RE | Payer: Self-pay | Source: Home / Self Care

## 2018-11-14 DIAGNOSIS — Z1211 Encounter for screening for malignant neoplasm of colon: Principal | ICD-10-CM

## 2018-11-14 SURGERY — COLONOSCOPY
Anesthesia: Moderate Sedation

## 2018-11-25 ENCOUNTER — Other Ambulatory Visit: Payer: Self-pay | Admitting: Obstetrics & Gynecology

## 2018-12-15 ENCOUNTER — Other Ambulatory Visit: Payer: Self-pay | Admitting: Adult Health

## 2018-12-19 DIAGNOSIS — H5203 Hypermetropia, bilateral: Secondary | ICD-10-CM | POA: Diagnosis not present

## 2019-01-21 DIAGNOSIS — Z23 Encounter for immunization: Secondary | ICD-10-CM | POA: Diagnosis not present

## 2019-03-07 ENCOUNTER — Other Ambulatory Visit: Payer: Self-pay | Admitting: Women's Health

## 2019-03-12 ENCOUNTER — Other Ambulatory Visit: Payer: Self-pay

## 2019-03-12 ENCOUNTER — Other Ambulatory Visit: Payer: Self-pay | Admitting: Women's Health

## 2019-03-12 ENCOUNTER — Telehealth: Payer: Self-pay | Admitting: *Deleted

## 2019-03-12 NOTE — Telephone Encounter (Signed)
Pt is requesting 1 refill on Topamax. She is in the middle of changing insurance. Can you refill one time? Thanks!! [place]

## 2019-03-13 MED ORDER — TOPIRAMATE 50 MG PO TABS: 50.0000 mg | ORAL_TABLET | Freq: Every day | ORAL | 0 refills | Status: AC

## 2019-03-13 NOTE — Telephone Encounter (Signed)
Refilled topamax

## 2019-05-22 DIAGNOSIS — Z23 Encounter for immunization: Secondary | ICD-10-CM | POA: Diagnosis not present

## 2019-06-19 DIAGNOSIS — Z23 Encounter for immunization: Secondary | ICD-10-CM | POA: Diagnosis not present

## 2021-05-16 ENCOUNTER — Emergency Department (HOSPITAL_COMMUNITY)
Admission: EM | Admit: 2021-05-16 | Discharge: 2021-05-16 | Disposition: A | Discharge status: Home / Self Care | Payer: BLUE CROSS/BLUE SHIELD | Attending: Emergency Medicine | Admitting: Emergency Medicine

## 2021-05-16 ENCOUNTER — Other Ambulatory Visit: Payer: Self-pay

## 2021-05-16 ENCOUNTER — Encounter (HOSPITAL_COMMUNITY): Payer: Self-pay

## 2021-05-16 ENCOUNTER — Emergency Department (HOSPITAL_COMMUNITY): Payer: BLUE CROSS/BLUE SHIELD

## 2021-05-16 DIAGNOSIS — M25522 Pain in left elbow: Secondary | ICD-10-CM | POA: Insufficient documentation

## 2021-05-16 DIAGNOSIS — R55 Syncope and collapse: Secondary | ICD-10-CM | POA: Insufficient documentation

## 2021-05-16 DIAGNOSIS — M542 Cervicalgia: Secondary | ICD-10-CM | POA: Diagnosis not present

## 2021-05-16 LAB — BASIC METABOLIC PANEL
Anion gap: 7 (ref 5–15)
BUN: 13 mg/dL (ref 6–20)
CO2: 28 mmol/L (ref 22–32)
Calcium: 9.4 mg/dL (ref 8.9–10.3)
Chloride: 107 mmol/L (ref 98–111)
Creatinine, Ser: 0.76 mg/dL (ref 0.44–1.00)
GFR, Estimated: >60 mL/min (ref >60)
Glucose, Bld: 99 mg/dL (ref 70–99)
Potassium: 4.1 mmol/L (ref 3.5–5.1)
Sodium: 142 mmol/L (ref 135–145)

## 2021-05-16 LAB — CBC WITH DIFFERENTIAL/PLATELET
Abs Immature Granulocytes: 0.01 10*3/uL (ref 0.00–0.07)
Basophils Absolute: 0 10*3/uL (ref 0.0–0.1)
Basophils Relative: 1 %
Eosinophils Absolute: 0.1 10*3/uL (ref 0.0–0.5)
Eosinophils Relative: 2 %
HCT: 41.9 % (ref 36.0–46.0)
Hemoglobin: 13.6 g/dL (ref 12.0–15.0)
Immature Granulocytes: 0 %
Lymphocytes Relative: 37 %
Lymphs Abs: 1.8 10*3/uL (ref 0.7–4.0)
MCH: 31.6 pg (ref 26.0–34.0)
MCHC: 32.5 g/dL (ref 30.0–36.0)
MCV: 97.2 fL (ref 80.0–100.0)
Monocytes Absolute: 0.4 10*3/uL (ref 0.1–1.0)
Monocytes Relative: 8 %
Neutro Abs: 2.5 10*3/uL (ref 1.7–7.7)
Neutrophils Relative %: 52 %
Platelets: 179 10*3/uL (ref 150–400)
RBC: 4.31 MIL/uL (ref 3.87–5.11)
RDW: 12.5 % (ref 11.5–15.5)
WBC: 4.8 10*3/uL (ref 4.0–10.5)
nRBC: 0 % (ref 0.0–0.2)

## 2021-05-16 LAB — TROPONIN I (HIGH SENSITIVITY)
Troponin I (High Sensitivity): 2 ng/L (ref <18)
Troponin I (High Sensitivity): 2 ng/L (ref <18)

## 2021-05-16 MED ORDER — IOHEXOL 350 MG/ML SOLN
75.0000 mL | Freq: Once | INTRAVENOUS | Status: AC | PRN
  Administered 2021-05-16: 75 mL via INTRAVENOUS

## 2021-05-16 NOTE — ED Notes (Signed)
Patient Alert and oriented to baseline. Stable and ambulatory to baseline. Patient verbalized understanding of the discharge instructions.  Patient belongings were taken by the patient.   

## 2021-05-16 NOTE — ED Triage Notes (Signed)
Patient states that she was at church and passed out and had reported seizure like activity. States that prior to incident happening she felt pain in base of neck with radiation to head and left arm felt cool. Felt weird sensation in left eye. No hx of seizures.

## 2021-05-16 NOTE — ED Notes (Signed)
MD made aware of patient symptoms, no code stroke to be called. States that she needs stat CT of head.

## 2021-05-16 NOTE — Discharge Instructions (Addendum)
Avoid strenuous activity until cleared by your doctors.  Follow-up with your neurologist this week.  You will need to call tomorrow to make an appointment.  Return back to the ER if you have new or worsening symptoms or have any additional concerns.

## 2021-05-16 NOTE — ED Provider Notes (Signed)
[facility] EMERGENCY DEPARTMENT Provider Note   CSN: [id number] Arrival date & time: 05/16/21  1223     History  Chief Complaint  Patient presents with   Loss of Consciousness    Sandra Carey is a 56 y.o. female.  Patient with history of MS, presents ER chief complaint of syncopal episode.  She states she was sitting down with her family and went to [facility] when she felt a popping sensation on the left side base of the skull/back of the neck.  The thing she knew she was waking up with her family around her.  They reportedly noticed that she had shaking during the time of unresponsiveness that lasted about 2 minutes.  On waking of the patient states that she no longer has any pain or discomfort but her left arm feels cool, with some pain radiating from her armpit to her elbow.  Denies any chest pain denies any headache at this time denies shortness of breath denies neck pain at this time.      Home Medications Prior to Admission medications   Medication Sig Start Date End Date Taking? Authorizing Provider  estradiol (ESTRACE) 2 MG tablet Take 0.5 tablets (1 mg total) by mouth daily. 06/20/17   Lazaro Arms, MD  ibuprofen (ADVIL) 800 MG tablet TAKE 1 TABLET BY MOUTH EVERY 8 HOURS AS NEEDED 11/26/18   Lazaro Arms, MD  lubiprostone (AMITIZA) 24 MCG capsule Take 1 capsule (24 mcg total) by mouth 2 (two) times daily with a meal. 08/07/18   Setzer, Brand Males, NP  Plecanatide (TRULANCE) 3 MG TABS Take 3 mg by mouth daily. 08/09/18   Setzer, Brand Males, NP  topiramate (TOPAMAX) 50 MG tablet Take 1 tablet (50 mg total) by mouth daily. 03/13/19   Adline Potter, NP      Allergies    Sulfa antibiotics    Review of Systems   Review of Systems  Constitutional:  Negative for fever.  HENT:  Negative for ear pain.   Eyes:  Negative for pain.  Respiratory:  Negative for cough.   Cardiovascular:  Negative for chest pain.  Gastrointestinal:  Negative for abdominal pain.  Genitourinary:   Negative for flank pain.  Musculoskeletal:  Negative for back pain.  Skin:  Negative for rash.  Neurological:  Negative for headaches.   Physical Exam Updated Vital Signs BP 114/66 (BP Location: Left Arm)    Pulse 74    Temp 98.1 F (36.7 C) (Oral)    Resp 16    Ht 5\' 5"  (1.651 m)    Wt 61.7 kg    SpO2 100%    BMI 22.63 kg/m  Physical Exam Constitutional:      General: She is not in acute distress.    Appearance: Normal appearance.  HENT:     Head: Normocephalic.     Nose: Nose normal.  Eyes:     Extraocular Movements: Extraocular movements intact.  Cardiovascular:     Rate and Rhythm: Normal rate.  Pulmonary:     Effort: Pulmonary effort is normal.  Musculoskeletal:        General: Normal range of motion.     Cervical back: Normal range of motion.     Comments: Bilateral upper extremities neurovascularly intact distal radial pulses 2+ and normal.  5/5 strength all extremities normal perfusion normal warmth on palpation.  Neurological:     General: No focal deficit present.     Mental Status: She is alert and oriented  to person, place, and time. Mental status is at baseline.     Cranial Nerves: No cranial nerve deficit.     Motor: No weakness.     Gait: Gait normal.     Comments: Strength is 5/5 all extremities.  Finger-nose intact gait is normal without assistance.    ED Results / Procedures / Treatments   Labs (all labs ordered are listed, but only abnormal results are displayed) Labs Reviewed  CBC WITH DIFFERENTIAL/PLATELET  BASIC METABOLIC PANEL  TROPONIN I (HIGH SENSITIVITY)  TROPONIN I (HIGH SENSITIVITY)    EKG EKG Interpretation  Date/Time:  Sunday May 16 2021 13:04:45 EST Ventricular Rate:  69 PR Interval:  152 QRS Duration: 75 QT Interval:  378 QTC Calculation: 405 R Axis:   66 Text Interpretation: Sinus rhythm Low voltage, precordial leads Minimal ST elevation, inferior leads Confirmed by Norman Clay ([id number]) on 05/16/2021 2:03:28  PM  Radiology CT ANGIO HEAD NECK W WO CM  Result Date: 05/16/2021 CLINICAL DATA:  Subarachnoid hemorrhage [facility]) EXAM: CT ANGIOGRAPHY HEAD AND NECK TECHNIQUE: Multidetector CT imaging of the head and neck was performed using the standard protocol during bolus administration of intravenous contrast. Multiplanar CT image reconstructions and MIPs were obtained to evaluate the vascular anatomy. Carotid stenosis measurements (when applicable) are obtained utilizing NASCET criteria, using the distal internal carotid diameter as the denominator. RADIATION DOSE REDUCTION: This exam was performed according to the departmental dose-optimization program which includes automated exposure control, adjustment of the mA and/or kV according to patient size and/or use of iterative reconstruction technique. CONTRAST:  22mL OMNIPAQUE IOHEXOL 350 MG/ML SOLN COMPARISON:  None. FINDINGS: CT HEAD FINDINGS Brain: No evidence of acute large vascular territory infarction, hemorrhage, hydrocephalus, extra-axial collection or mass lesion/mass effect. Vascular: See below. Skull: No acute fracture. Sinuses: Visualized sinuses are clear. Orbits: No acute finding. Review of the MIP images confirms the above findings CTA NECK FINDINGS Aortic arch: Great vessel origins are patent without significant stenosis. Right carotid system: No evidence of dissection, stenosis (50% or greater) or occlusion. Mild narrowing of the ICA the skull base. Left carotid system: No evidence of dissection, stenosis (50% or greater) or occlusion. Vertebral arteries: Left dominant. Limited visualization of the proximal right vertebral artery due to streak artifact. Within this limitation, no evidence of dissection, stenosis (50% or greater) or occlusion. Skeleton: No evidence of acute abnormality on limited assessment. Moderate to severe degenerative disc disease greatest at C5-C6. Other neck: No evidence of acute abnormality on limited assessment. Upper chest:  Calcified granulomas in bilateral upper lobe. Otherwise, visualized lung apices are clear. Review of the MIP images confirms the above findings CTA HEAD FINDINGS Anterior circulation: Bilateral intracranial ICAs MCAs and ACAs are patent without proximal hemodynamically significant stenosis. No aneurysm identified. Posterior circulation: Mild left dominant vertebral artery. Bilateral intradural vertebral arteries, basilar artery, and posterior cerebral arteries are patent without proximal hemodynamically significant stenosis. Right posterior communicating artery noted. No aneurysm identified. Venous sinuses: As permitted by contrast timing, patent. Review of the MIP images confirms the above findings IMPRESSION: 1. No evidence of acute intracranial abnormality. 2. No large vessel occlusion or evidence of proximal hemodynamically significant stenosis or aneurysm. Electronically Signed   By: Feliberto Harts M.D.   On: 05/16/2021 14:59    Procedures Procedures    Medications Ordered in ED Medications  iohexol (OMNIPAQUE) 350 MG/ML injection 75 mL (75 mLs Intravenous Contrast Given 05/16/21 1407)    ED Course/ Medical Decision Making/ A&P  Medical Decision Making Amount and/or Complexity of Data Reviewed Labs: ordered. Radiology: ordered.  Risk Prescription drug management.   Chart review shows no recent primary care visits within this last year.  Nursing staff approached me regarding this patient's triage information.  There was concern for dissection versus subarachnoid hemorrhage leading to syncope.  Onset of imaging was less than 3 hours since onset of symptoms which was unremarkable.  No acute findings noted per radiology.  Patient remains asymptomatic with no weakness or numbness and no pain.  Extremity pulses remain intact and normal bilaterally.  Etiology of her symptoms is unclear.  Will advise outpatient follow-up with her doctor within the week.  Recommend  immediate return for worsening symptoms or any additional concerns.  Recommending avoidance of strenuous activity until cleared by her doctor.        Final Clinical Impression(s) / ED Diagnoses Final diagnoses:  Syncope and collapse  Neck pain    Rx / DC Orders ED Discharge Orders     None         Cheryll Cockayne, MD 05/16/21 1537

## 2022-08-11 ENCOUNTER — Other Ambulatory Visit: Payer: Self-pay | Admitting: Psychiatry

## 2022-08-11 ENCOUNTER — Other Ambulatory Visit: Payer: Self-pay

## 2022-08-11 DIAGNOSIS — G379 Demyelinating disease of central nervous system, unspecified: Secondary | ICD-10-CM

## 2022-08-19 ENCOUNTER — Other Ambulatory Visit: Payer: Self-pay | Admitting: Psychiatry

## 2022-08-19 DIAGNOSIS — G379 Demyelinating disease of central nervous system, unspecified: Secondary | ICD-10-CM

## 2022-09-03 IMAGING — CT CT ANGIO HEAD-NECK (W OR W/O PERF)
2 of 11 series · 6 of 34 positions shown · IV contrast (Omnipaque or Isovue)
Comparison: None.

CLINICAL DATA: Subarachnoid hemorrhage (TO [facility])

EXAM:
CT ANGIOGRAPHY HEAD AND NECK
TECHNIQUE: Multidetector CT imaging of the head and neck was performed using
the standard protocol during bolus administration of intravenous
contrast. Multiplanar CT image reconstructions and MIPs were
obtained to evaluate the vascular anatomy. Carotid stenosis
measurements (when applicable) are obtained utilizing NASCET
criteria, using the distal internal carotid diameter as the
denominator.

[Series 8: sagittal soft · sagittal · 0.35mm/px · 1 of 60 slices shown]
[im 58/60  soft-tissue]
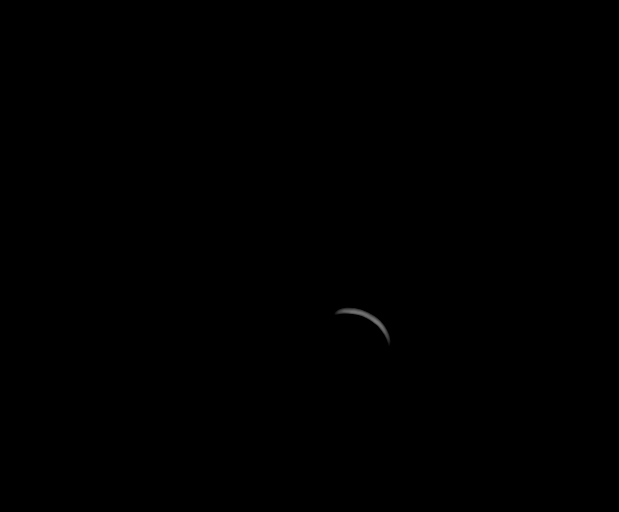

[Series 10: ax thins · axial · 0.39mm/px · z∈[-131,+121]mm · 5 of 378 slices shown]
[im 63/378  soft-tissue]
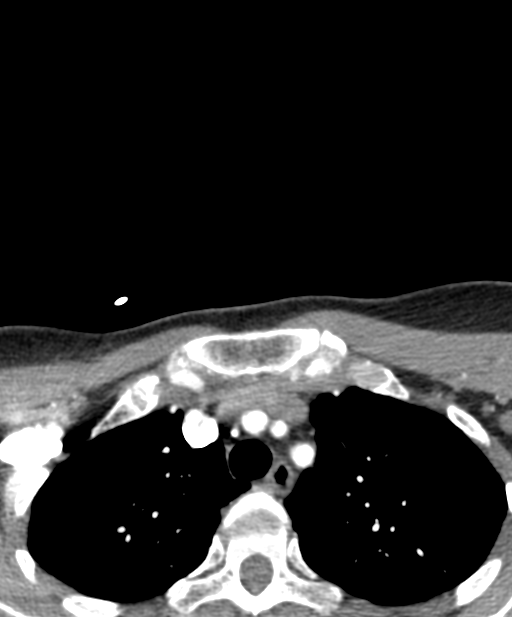
[im 126/378  bone]
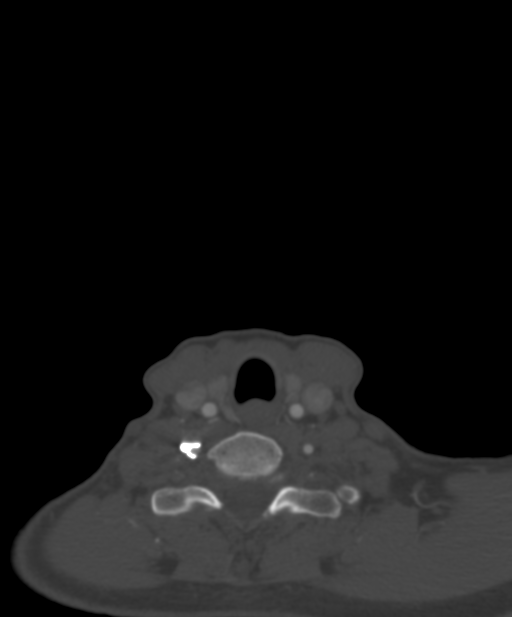
[im 189/378  soft-tissue]
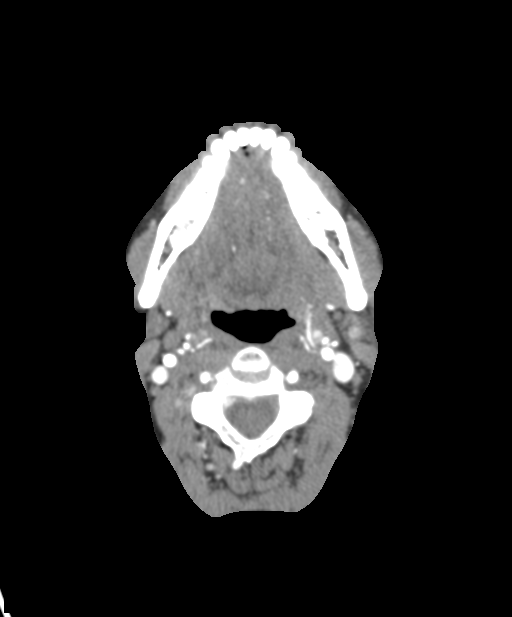
[im 252/378  bone]
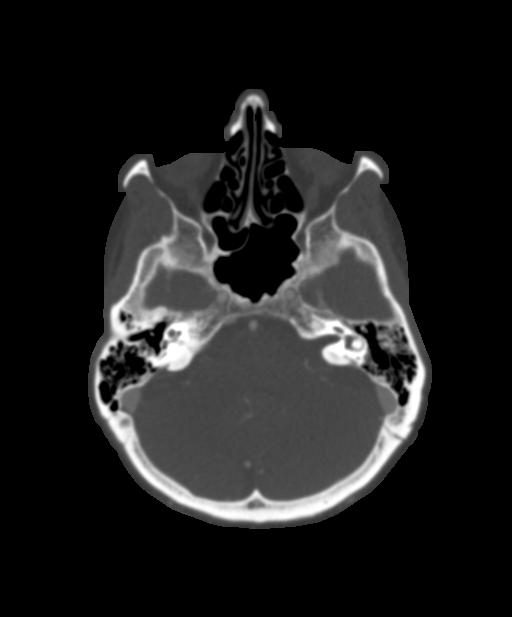
[im 315/378  soft-tissue]
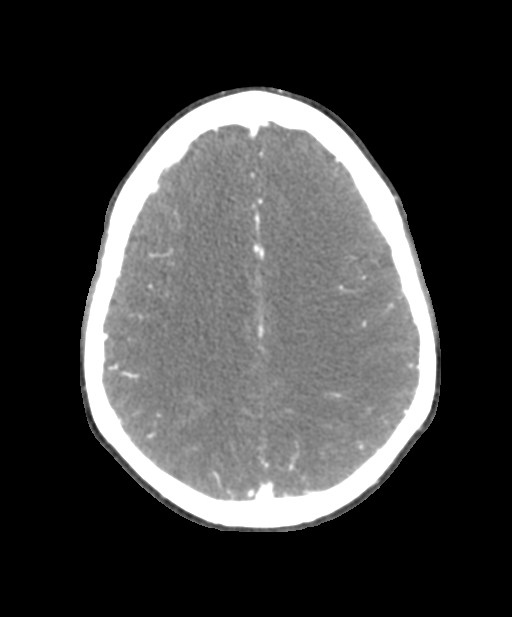

[6 of 34 positions shown; findings below may reference images not displayed]

RADIATION DOSE REDUCTION: This exam was performed according to the
departmental dose-optimization program which includes automated
exposure control, adjustment of the mA and/or kV according to
patient size and/or use of iterative reconstruction technique.

CONTRAST:  75mL OMNIPAQUE IOHEXOL 350 MG/ML SOLN
FINDINGS: CT HEAD FINDINGS

Brain: No evidence of acute large vascular territory infarction,
hemorrhage, hydrocephalus, extra-axial collection or mass
lesion/mass effect.

Vascular: See below.

Skull: No acute fracture.

Sinuses: Visualized sinuses are clear.

Orbits: No acute finding.

Review of the MIP images confirms the above findings

CTA NECK FINDINGS

Aortic arch: Great vessel origins are patent without significant
stenosis.

Right carotid system: No evidence of dissection, stenosis (50% or
greater) or occlusion. Mild narrowing of the ICA the skull base.

Left carotid system: No evidence of dissection, stenosis (50% or
greater) or occlusion.

Vertebral arteries: Left dominant. Limited visualization of the
proximal right vertebral artery due to streak artifact. Within this
limitation, no evidence of dissection, stenosis (50% or greater) or
occlusion.

Skeleton: No evidence of acute abnormality on limited assessment.
Moderate to severe degenerative disc disease greatest at C5-C6.

Other neck: No evidence of acute abnormality on limited assessment.

Upper chest: Calcified granulomas in bilateral upper lobe.
Otherwise, visualized lung apices are clear.

Review of the MIP images confirms the above findings

CTA HEAD FINDINGS

Anterior circulation: Bilateral intracranial ICAs MCAs and ACAs are
patent without proximal hemodynamically significant stenosis. No
aneurysm identified.

Posterior circulation: Mild left dominant vertebral artery.
Bilateral intradural vertebral arteries, basilar artery, and
posterior cerebral arteries are patent without proximal
hemodynamically significant stenosis. Right posterior communicating
artery noted. No aneurysm identified.

Venous sinuses: As permitted by contrast timing, patent.

Review of the MIP images confirms the above findings
IMPRESSION: 1. No evidence of acute intracranial abnormality.
2. No large vessel occlusion or evidence of proximal hemodynamically
significant stenosis or aneurysm.

## 2022-09-12 ENCOUNTER — Ambulatory Visit (INDEPENDENT_AMBULATORY_CARE_PROVIDER_SITE_OTHER): Payer: BLUE CROSS/BLUE SHIELD | Admitting: Gastroenterology

## 2022-10-03 ENCOUNTER — Other Ambulatory Visit: Payer: BLUE CROSS/BLUE SHIELD

## 2022-10-06 ENCOUNTER — Other Ambulatory Visit: Payer: BLUE CROSS/BLUE SHIELD

## 2022-10-12 NOTE — Progress Notes (Signed)
 [facility] Initial Consultation Visit    Reason for Visit: Constipation Referring Provider: Lauraine JINNY Basque  Primary Care Provider: Rozena Nat Brim, DO  Assessment & Plan: Sandra Carey is a 57 y.o. female with a PMH of MS who is seen here today for chronic idiopathic constipation.   # Severe Chronic Idiopathic Constipation:  # Concern for Dyssynergic Defecation: - Anal Rectal Manometry- discussed details with the patient - Mag Ox 800mg - 1000mg  daily + Amitiza - start at 8mcg and then titrate up to 24mcg BID -We discussed reasonable expectations setting for laxatives.  Discussing that each laxative can increase bowel movements by 1 to 2/week and that given the severity of her constipation, I would start with this above regimen but would not anticipate her having daily bowel movements on this regimen.  Discussed if she has no improvement on the above regimen, we would see if her insurance will cover Linzess . -Patient reports her colonoscopy was incomplete.  Reports that they did not make it to cecum and that she had poor bowel prep (only did 1 day of bowel prep).  We discussed repeating colonoscopy with a multiday bowel prep and scheduling her on a Monday or Tuesday in case she needed to keep prepping and reschedule later in the week. - CBC and CMP  # Fissure - Nifedipine 0.3%/lidocaine  1.5% ointment.  Apply twice a day and then continue at least 6 weeks after pain resolves.  Comer Peacock, MD Assistant Professor of Medicine [facility] Gastroenterology and Hepatology  Return in about 3 months (around 01/12/2023).     Subjective HPI: Sandra Carey is a 57 y.o. female with a PMH of multiple sclerosis, Sjogren's, hypothyroidism that is seen in consultation at the request of Lauraine JINNY Basque for constipation.  Constipation History: Takes multiple laxative- apple juice, prune juice, tried senna. Tried double doses of  laxatives. When she does go to the bathroom- little rabit poops.   Patient reports feeling constipated for 1-2 years. Typical stool frequency: has 1 bowel movement every 3-4 days (2/week).   Consistency of stools: Bristol stool scale 1 (with laxatives type 3) Sensation of incomplete evacuation? Yes. 75% Straining with defecation: Yes. 100% Manual disimpaction / splinting: Yes. Blood in stool? No.  Dyspareunia: sometimes History of pelvic floor dysfunction: Yes. History of vaginal child birth: Yes. 2 full term, vaginal deliveries with tearing with both, episiotomy  Pain with defecation: Yes. Sharp pain in the rectum Abdominal pain (0-10): yes- outside of abdomen sharp driving pain Presence of diarrhea: No. History of irritable bowel syndrome: No. Prior bowel surgery: No. Alarm Sx (new change in Bms, anemia, weight loss, etc): No. Change in medication: No  Prior bowel regimen medication trials/response: - apple juice and prune juice and wheat germ - senna- every day- could get a small bowel every 2-3 days - miralax- 3 capfuls/day- no improvement - magnesium based supplement- nothing, felt sick    Prior studies: Anorectal manometry: None Sitz marker study: None Defecogram: None Imaging: No abdominal imaging available for review Colonoscopy: 2022-per patient report-did only 1 day of bowel prep and knew she was not clean.  Was told to present for colonoscopy anyway.  Reports that endoscopist did not reach cecum and that she was told bowel prep was inadequate.  Medical History: Past Medical History:  Diagnosis Date  . Hypothyroid   . Multiple sclerosis (CMS-HCC)   . Sjogren's disease (CMS-HCC)    Surgical History: Past Surgical History:  Procedure Laterality Date  .  BUNIONECTOMY Bilateral   . CHOLECYSTECTOMY    . HYSTERECTOMY    . skin cancer removal      Family History: Family History  Problem Relation Age of Onset  . Dementia Father   . Bipolar disorder Father   .  Osteoarthritis Mother    No family history of GI illness  Social History: Social History   Tobacco Use  . Smoking status: Former    Current packs/day: 0.50    Average packs/day: 0.5 packs/day for 4.5 years (2.3 ttl pk-yrs)    Types: Cigarettes    Start date: 03/28/2018  . Smokeless tobacco: Never  Substance Use Topics  . Alcohol use: Never  . 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, theatre/television/film for [employer]  Medications:  Prior to Admission medications   Medication Dose, Route, Frequency  DULoxetine (CYMBALTA) 20 MG capsule 20 mg, Oral, Daily (standard)  levothyroxine  (SYNTHROID ) 50 MCG tablet TAKE 1 TABLET BY MOUTH DAILY IN THE MORNING ON AN EMPTY STOMACH  trospium (SANCTURA) 20 mg tablet 20 mg, Oral, 2 times a day (standard) Patient not taking: Reported on 09/02/2022    Allergies: Sulfa (sulfonamide antibiotics)        Objective Vital Signs: Vitals:   10/12/22 1337  BP: 96/71  BP Site: L Arm  BP Position: Sitting  Pulse: 80  Temp: 36.4 C (97.5 F)  TempSrc: Temporal  Weight: 72.8 kg (160 lb 6.4 oz)  Height: 165.1 cm (5' 5)    Body mass index is 26.69 kg/m.  Physical Exam:  Gen: NAD, answers questions appropriately Eyes: Sclera anicteric, EOMI HENT: atraumatic, normocephalic, MMM.   Lungs:breathing comfortably on RA, no auditory wheezes Supervised Rectal Exam: Small external hemorrhoids, fissure.  Appropriate squeeze.  Weak perineal descent with push. Extremities: no cyanosis or edema Neuro: CN II-XI grossly intact. No focal deficits. Skin:  No rashes, lesions on clothed exam Psych: Alert and oriented, normal mood and affect.    I personally spent 47 minutes face-to-face and non-face-to-face in the care of this patient, which includes all pre, intra, and post visit time on the date of service.  All documented time was specific to the E/M visit and does not include any procedures that may have been performed.

## 2023-12-12 ENCOUNTER — Encounter (INDEPENDENT_AMBULATORY_CARE_PROVIDER_SITE_OTHER): Payer: Self-pay | Admitting: *Deleted

## 2023-12-26 ENCOUNTER — Ambulatory Visit (INDEPENDENT_AMBULATORY_CARE_PROVIDER_SITE_OTHER): Admitting: Gastroenterology

## 2024-01-03 ENCOUNTER — Ambulatory Visit (INDEPENDENT_AMBULATORY_CARE_PROVIDER_SITE_OTHER): Payer: Self-pay | Admitting: Gastroenterology

## 2024-01-03 ENCOUNTER — Encounter (INDEPENDENT_AMBULATORY_CARE_PROVIDER_SITE_OTHER): Payer: Self-pay | Admitting: Gastroenterology

## 2024-01-03 VITALS — BP 116/73 | HR 78 | Temp 97.8°F | Ht 64.5 in | Wt 136.5 lb

## 2024-01-03 DIAGNOSIS — Z1211 Encounter for screening for malignant neoplasm of colon: Secondary | ICD-10-CM | POA: Insufficient documentation

## 2024-01-03 DIAGNOSIS — K219 Gastro-esophageal reflux disease without esophagitis: Secondary | ICD-10-CM | POA: Insufficient documentation

## 2024-01-03 DIAGNOSIS — R053 Chronic cough: Secondary | ICD-10-CM

## 2024-01-03 DIAGNOSIS — K5904 Chronic idiopathic constipation: Secondary | ICD-10-CM | POA: Insufficient documentation

## 2024-01-03 DIAGNOSIS — K5909 Other constipation: Secondary | ICD-10-CM

## 2024-01-03 MED ORDER — POLYETHYLENE GLYCOL 3350 17 G PO PACK: 17.0000 g | PACK | Freq: Two times a day (BID) | ORAL | 0 refills | Status: AC

## 2024-01-03 MED ORDER — PANTOPRAZOLE SODIUM 40 MG PO TBEC: 40.0000 mg | DELAYED_RELEASE_TABLET | Freq: Every day | ORAL | 3 refills | Status: AC

## 2024-01-03 MED ORDER — PSYLLIUM 58.6 % PO PACK: 1.0000 | PACK | Freq: Two times a day (BID) | ORAL | 2 refills | Status: AC

## 2024-01-03 NOTE — Progress Notes (Signed)
 Jadon Ressler Faizan Lon Klippel , M.D. Gastroenterology & Hepatology [facility] Gastroenterology [address] [place], [place] [address] Primary Care Physician: Margarete Maeola DASEN, FNP [address] [place] [address]  Chief Complaint: Chronic cough, constipation and screening colonoscopy  History of Present Illness:  Sandra Carey is a 58 y.o. female with multiple sclerosis, Sjogren's, hypothyroidism , chronic constipation who presents for evaluation of Chronic cough, constipation and screening colonoscopy  Patient reports that she had COVID-pneumonia twice and since then her symptoms have persisted.  Reports her main complaint is spells of cough which would happen if she takes deep breath.  At 1 time she was bleeding into the deep breath and had episodes of cough.  Patient eats normal food without any diet modification.  Denies any slowing of food or regurgitation at all  .Patient was referred here for dysphagia but on further questioning she denies any difficulty/painfull swallowing at all .The patient denies having any nausea, vomiting, fever, chills, hematochezia, melena, hematemesis, abdominal distention, abdominal pain, diarrhea, jaundice, pruritus or weight loss.  Patient has not tried PPI  Last ZHI:wnwz Last Colonoscopy: 2022-per patient report-did only 1 day of bowel prep and knew she was not clean. Was told to present for colonoscopy anyway. Reports that endoscopist did not reach cecum and that she was told bowel prep was inadequate.   FHx: neg for any gastrointestinal/liver disease, no malignancies Social: neg smoking, alcohol or illicit drug use Surgical: no abdominal surgeries  Past Medical History: Past Medical History:  Diagnosis Date   Depression    Generalized headaches    HA (headache)    Hypothyroid    MS (multiple sclerosis)     Past Surgical History: Past Surgical History:  Procedure Laterality Date   AUGMENTATION MAMMAPLASTY Bilateral     bunions surgery     Dr. Viktoria 8-9 yrs ago   CHOLECYSTECTOMY N/A 10/15/2012   Procedure: LAPAROSCOPIC CHOLECYSTECTOMY;  Surgeon: Oneil DELENA Budge, MD;  Location: [facility];  Service: General;  Laterality: N/A;   VAGINAL HYSTERECTOMY     ovaries remained    Family History:No family history on file.  Social History: Social History   Tobacco Use  Smoking Status Former  Smokeless Tobacco Never   Social History   Substance and Sexual Activity  Alcohol Use Yes   Alcohol/week: 0.0 standard drinks of alcohol   Comment: rarely   Social History   Substance and Sexual Activity  Drug Use No    Allergies: Allergies  Allergen Reactions   Sulfa Antibiotics Other (See Comments)    Dizziness. Causes patient to pass out.     Medications: Current Outpatient Medications  Medication Sig Dispense Refill   estradiol  (ESTRACE ) 2 MG tablet Take 0.5 tablets (1 mg total) by mouth daily. 30 tablet 11   ibuprofen  (ADVIL ) 800 MG tablet TAKE 1 TABLET BY MOUTH EVERY 8 HOURS AS NEEDED 60 tablet 2   lubiprostone  (AMITIZA ) 24 MCG capsule Take 1 capsule (24 mcg total) by mouth 2 (two) times daily with a meal. 60 capsule 3   Plecanatide  (TRULANCE ) 3 MG TABS Take 3 mg by mouth daily. 30 tablet 5   topiramate  (TOPAMAX ) 50 MG tablet Take 1 tablet (50 mg total) by mouth daily. 90 tablet 0   No current facility-administered medications for this visit.    Review of Systems: GENERAL: negative for malaise, night sweats HEENT: No changes in hearing or vision, no nose bleeds or other nasal problems. NECK: Negative for lumps,  goiter, pain and significant neck swelling RESPIRATORY: Negative for cough, wheezing CARDIOVASCULAR: Negative for chest pain, leg swelling, palpitations, orthopnea GI: SEE HPI MUSCULOSKELETAL: Negative for joint pain or swelling, back pain, and muscle pain. SKIN: Negative for lesions, rash HEMATOLOGY Negative for prolonged bleeding, bruising easily, and swollen nodes. ENDOCRINE:  Negative for cold or heat intolerance, polyuria, polydipsia and goiter. NEURO: negative for tremor, gait imbalance, syncope and seizures. The remainder of the review of systems is noncontributory.   Physical Exam: There were no vitals taken for this visit. GENERAL: The patient is AO x3, in no acute distress. HEENT: Head is normocephalic and atraumatic. EOMI are intact. Mouth is well hydrated and without lesions. NECK: Supple. No masses LUNGS: Clear to auscultation. No presence of rhonchi/wheezing/rales. Adequate chest expansion HEART: RRR, normal s1 and s2. ABDOMEN: Soft, nontender, no guarding, no peritoneal signs, and nondistended. BS +. No masses.   Imaging/Labs: as above     Latest Ref Rng & Units 05/16/2021   12:56 PM 11/05/2014    9:34 AM 10/11/2012    1:45 PM  CBC  WBC 4.0 - 10.5 K/uL 4.8  4.0  6.6   Hemoglobin 12.0 - 15.0 g/dL 86.3  85.8  85.9   Hematocrit 36.0 - 46.0 % 41.9  40.2  39.6   Platelets 150 - 400 K/uL 179  175  184    No results found for: IRON, TIBC, FERRITIN  I personally reviewed and interpreted the available labs, imaging and endoscopic files.  Impression and Plan:    Sandra Carey is a 58 y.o. female with multiple sclerosis, Sjogren's, hypothyroidism , chronic constipation who presents for evaluation of Chronic cough, constipation and screening colonoscopy  #Chronic cough   Patient with chronic cough could be pulmonary related with history of COVID pneumonia twice and triggered with deep breath.  Recently was given inhaler which significantly improved her symptoms  Denies any dysphagia on repeated questioning and main complaint is intermittent spells of cough  This could be atypical symptoms of GERD.  Although patient has not tried PPI  Will start the patient on Protonix 40 mg daily 30 minutes before breakfast and reevaluate the symptoms  If symptoms persist may recommend upper endoscopy with Bravo placement(wireless pH monitoring)  to  reevaluate patient's symptoms are due to any pathological reflux  #Screening Colon cancer   The patient was counseled regarding the importance of colorectal cancer screening, particularly starting when she had incomplete colonoscopy previously. The benefits of screening include early detection of colorectal cancer and precancerous polyps, which can improve treatment outcomes and reduce mortality. Risks associated with screening, particularly colonoscopy, include potential complications such as bleeding and perforation. After deciding different modalities for screening for colon cancer , patient has opted to pursue Colonoscopy a  Although currently patient is unsure and want to defer the procedure for now until she gets insurance in next few months.  #Constipation  Patient has chronic constipation, was previously seen at [facility].   currently having 1 bowel movement daily lifestyle modification.  Patient will try Amitiza  previously was prescribed 1 year ago   Ensure adequate fluid intake: Aim for 8 glasses of water daily. Follow a high fiber diet: Include foods such as dates, prunes, pears, and kiwi. Take Miralax twice a day for the first week, then reduce to once daily thereafter. Use Metamucil twice a day.   All questions were answered.      Jessup Ogas Faizan Sharelle Burditt, MD Gastroenterology and Hepatology [facility] Gastroenterology  This chart has been completed using Engineer, civil (consulting) software, and while attempts have been made to ensure accuracy , certain words and phrases may not be transcribed as intended

## 2024-01-03 NOTE — Patient Instructions (Signed)
 It was very nice to meet you today, as dicussed with will plan for the following :  1)Ensure adequate fluid intake: Aim for 8 glasses of water daily. Follow a high fiber diet: Include foods such as dates, prunes, pears, and kiwi. Take Miralax twice a day for the first week, then reduce to once daily thereafter. Use Metamucil twice a day.   2)protonix 40mg  daily - advised to take 30 min before breakfast  GERD recommendations:  1) Avoid coffee, tea, cola beverages, carbonated beverages, spicy foods, greasy foods, foods high in acid content (e.g. tomatoes and citrus fruits), chocolate, and peppermint 2) Avoid drinking alcoholic beverages 3) Avoid smoking 4) Eat small meals and keep weight within normal range 5) Avoid recumbent posture for 3 hours post-prandially 6) Elevate head of bed

## 2024-03-21 ENCOUNTER — Other Ambulatory Visit: Payer: Self-pay | Admitting: Medical Genetics
